# Patient Record
Sex: Female | Born: 1945
Health system: Southern US, Community
[De-identification: ages and names within clinical notes are randomized; demographics above are authoritative.]

## PROBLEM LIST (undated history)

## (undated) DIAGNOSIS — K219 Gastro-esophageal reflux disease without esophagitis: Secondary | ICD-10-CM

## (undated) DIAGNOSIS — F5104 Psychophysiologic insomnia: Secondary | ICD-10-CM

## (undated) DIAGNOSIS — K7460 Unspecified cirrhosis of liver: Secondary | ICD-10-CM

## (undated) DIAGNOSIS — K760 Fatty (change of) liver, not elsewhere classified: Secondary | ICD-10-CM

## (undated) DIAGNOSIS — IMO0002 Reserved for concepts with insufficient information to code with codable children: Secondary | ICD-10-CM

## (undated) DIAGNOSIS — H269 Unspecified cataract: Secondary | ICD-10-CM

## (undated) DIAGNOSIS — N951 Menopausal and female climacteric states: Secondary | ICD-10-CM

## (undated) DIAGNOSIS — I4891 Unspecified atrial fibrillation: Secondary | ICD-10-CM

## (undated) DIAGNOSIS — E876 Hypokalemia: Secondary | ICD-10-CM

## (undated) DIAGNOSIS — E785 Hyperlipidemia, unspecified: Secondary | ICD-10-CM

## (undated) DIAGNOSIS — E559 Vitamin D deficiency, unspecified: Secondary | ICD-10-CM

## (undated) DIAGNOSIS — I491 Atrial premature depolarization: Secondary | ICD-10-CM

## (undated) DIAGNOSIS — E11319 Type 2 diabetes mellitus with unspecified diabetic retinopathy without macular edema: Secondary | ICD-10-CM

## (undated) DIAGNOSIS — M47816 Spondylosis without myelopathy or radiculopathy, lumbar region: Secondary | ICD-10-CM

## (undated) DIAGNOSIS — M51369 Other intervertebral disc degeneration, lumbar region without mention of lumbar back pain or lower extremity pain: Secondary | ICD-10-CM

## (undated) DIAGNOSIS — Z87442 Personal history of urinary calculi: Secondary | ICD-10-CM

## (undated) DIAGNOSIS — M8589 Other specified disorders of bone density and structure, multiple sites: Secondary | ICD-10-CM

## (undated) DIAGNOSIS — E1165 Type 2 diabetes mellitus with hyperglycemia: Secondary | ICD-10-CM

## (undated) DIAGNOSIS — I10 Essential (primary) hypertension: Secondary | ICD-10-CM

## (undated) DIAGNOSIS — E1142 Type 2 diabetes mellitus with diabetic polyneuropathy: Secondary | ICD-10-CM

## (undated) DIAGNOSIS — M5136 Other intervertebral disc degeneration, lumbar region: Secondary | ICD-10-CM

## (undated) DIAGNOSIS — R252 Cramp and spasm: Secondary | ICD-10-CM

## (undated) HISTORY — PX: TOTAL KNEE ARTHROPLASTY: SHX125

## (undated) HISTORY — DX: Menopausal and female climacteric states: N95.1

## (undated) HISTORY — DX: Reserved for concepts with insufficient information to code with codable children: IMO0002

## (undated) HISTORY — DX: Hypokalemia: E87.6

## (undated) HISTORY — DX: Type 2 diabetes mellitus with hyperglycemia: E11.65

## (undated) HISTORY — DX: Type 2 diabetes mellitus with unspecified diabetic retinopathy without macular edema: E11.319

## (undated) HISTORY — DX: Vitamin D deficiency, unspecified: E55.9

## (undated) HISTORY — DX: Unspecified cirrhosis of liver: K74.60

## (undated) HISTORY — DX: Fatty (change of) liver, not elsewhere classified: K76.0

## (undated) HISTORY — DX: Hyperlipidemia, unspecified: E78.5

## (undated) HISTORY — DX: Spondylosis without myelopathy or radiculopathy, lumbar region: M47.816

## (undated) HISTORY — DX: Cramp and spasm: R25.2

## (undated) HISTORY — DX: Other intervertebral disc degeneration, lumbar region: M51.36

## (undated) HISTORY — DX: Other specified disorders of bone density and structure, multiple sites: M85.89

## (undated) HISTORY — PX: HEMORRHOID SURGERY: SHX153

## (undated) HISTORY — DX: Atrial premature depolarization: I49.1

## (undated) HISTORY — DX: Unspecified atrial fibrillation: I48.91

## (undated) HISTORY — DX: Other intervertebral disc degeneration, lumbar region without mention of lumbar back pain or lower extremity pain: M51.369

## (undated) HISTORY — DX: Unspecified cataract: H26.9

## (undated) HISTORY — DX: Type 2 diabetes mellitus with diabetic polyneuropathy: E11.42

## (undated) HISTORY — DX: Essential (primary) hypertension: I10

## (undated) HISTORY — DX: Psychophysiologic insomnia: F51.04

## (undated) HISTORY — DX: Gastro-esophageal reflux disease without esophagitis: K21.9

---

## 1975-07-30 HISTORY — PX: TUBAL LIGATION: SHX77

## 1975-07-30 HISTORY — PX: APPENDECTOMY: SHX54

## 1998-07-29 HISTORY — PX: MEDIAL PARTIAL KNEE REPLACEMENT: SHX5965

## 2003-07-30 HISTORY — PX: CARPAL TUNNEL RELEASE: SHX101

## 2004-07-29 HISTORY — PX: MEDIAL PARTIAL KNEE REPLACEMENT: SHX5965

## 2006-07-29 HISTORY — PX: TOTAL KNEE ARTHROPLASTY: SHX125

## 2010-07-29 HISTORY — PX: REPLACEMENT TOTAL KNEE: SUR1224

## 2015-11-01 ENCOUNTER — Ambulatory Visit: Payer: PRIVATE HEALTH INSURANCE | Admitting: Sports Medicine

## 2016-07-31 DIAGNOSIS — M25562 Pain in left knee: Secondary | ICD-10-CM | POA: Diagnosis not present

## 2016-07-31 DIAGNOSIS — Z96652 Presence of left artificial knee joint: Secondary | ICD-10-CM | POA: Diagnosis not present

## 2016-07-31 DIAGNOSIS — Z471 Aftercare following joint replacement surgery: Secondary | ICD-10-CM | POA: Diagnosis not present

## 2016-08-19 DIAGNOSIS — E785 Hyperlipidemia, unspecified: Secondary | ICD-10-CM | POA: Diagnosis not present

## 2016-08-19 DIAGNOSIS — E559 Vitamin D deficiency, unspecified: Secondary | ICD-10-CM | POA: Diagnosis not present

## 2016-08-19 DIAGNOSIS — E1140 Type 2 diabetes mellitus with diabetic neuropathy, unspecified: Secondary | ICD-10-CM | POA: Diagnosis not present

## 2016-08-21 DIAGNOSIS — K760 Fatty (change of) liver, not elsewhere classified: Secondary | ICD-10-CM | POA: Diagnosis not present

## 2016-08-21 DIAGNOSIS — E559 Vitamin D deficiency, unspecified: Secondary | ICD-10-CM | POA: Diagnosis not present

## 2016-08-21 DIAGNOSIS — E1140 Type 2 diabetes mellitus with diabetic neuropathy, unspecified: Secondary | ICD-10-CM | POA: Diagnosis not present

## 2016-08-21 DIAGNOSIS — E785 Hyperlipidemia, unspecified: Secondary | ICD-10-CM | POA: Diagnosis not present

## 2016-08-21 DIAGNOSIS — I10 Essential (primary) hypertension: Secondary | ICD-10-CM | POA: Diagnosis not present

## 2016-10-28 DIAGNOSIS — Z1231 Encounter for screening mammogram for malignant neoplasm of breast: Secondary | ICD-10-CM | POA: Diagnosis not present

## 2016-11-13 DIAGNOSIS — H00012 Hordeolum externum right lower eyelid: Secondary | ICD-10-CM | POA: Diagnosis not present

## 2016-11-20 DIAGNOSIS — E559 Vitamin D deficiency, unspecified: Secondary | ICD-10-CM | POA: Diagnosis not present

## 2016-11-20 DIAGNOSIS — E1140 Type 2 diabetes mellitus with diabetic neuropathy, unspecified: Secondary | ICD-10-CM | POA: Diagnosis not present

## 2016-11-20 DIAGNOSIS — E785 Hyperlipidemia, unspecified: Secondary | ICD-10-CM | POA: Diagnosis not present

## 2016-11-22 DIAGNOSIS — Z139 Encounter for screening, unspecified: Secondary | ICD-10-CM | POA: Diagnosis not present

## 2016-11-22 DIAGNOSIS — I10 Essential (primary) hypertension: Secondary | ICD-10-CM | POA: Diagnosis not present

## 2016-11-22 DIAGNOSIS — E1140 Type 2 diabetes mellitus with diabetic neuropathy, unspecified: Secondary | ICD-10-CM | POA: Diagnosis not present

## 2016-11-22 DIAGNOSIS — E785 Hyperlipidemia, unspecified: Secondary | ICD-10-CM | POA: Diagnosis not present

## 2016-11-22 DIAGNOSIS — E559 Vitamin D deficiency, unspecified: Secondary | ICD-10-CM | POA: Diagnosis not present

## 2017-02-24 DIAGNOSIS — E785 Hyperlipidemia, unspecified: Secondary | ICD-10-CM | POA: Diagnosis not present

## 2017-02-24 DIAGNOSIS — E559 Vitamin D deficiency, unspecified: Secondary | ICD-10-CM | POA: Diagnosis not present

## 2017-02-24 DIAGNOSIS — E1140 Type 2 diabetes mellitus with diabetic neuropathy, unspecified: Secondary | ICD-10-CM | POA: Diagnosis not present

## 2017-02-24 DIAGNOSIS — E1165 Type 2 diabetes mellitus with hyperglycemia: Secondary | ICD-10-CM | POA: Diagnosis not present

## 2017-02-26 DIAGNOSIS — I10 Essential (primary) hypertension: Secondary | ICD-10-CM | POA: Diagnosis not present

## 2017-02-26 DIAGNOSIS — E1140 Type 2 diabetes mellitus with diabetic neuropathy, unspecified: Secondary | ICD-10-CM | POA: Diagnosis not present

## 2017-02-26 DIAGNOSIS — Z139 Encounter for screening, unspecified: Secondary | ICD-10-CM | POA: Diagnosis not present

## 2017-02-26 DIAGNOSIS — Z9181 History of falling: Secondary | ICD-10-CM | POA: Diagnosis not present

## 2017-02-26 DIAGNOSIS — E785 Hyperlipidemia, unspecified: Secondary | ICD-10-CM | POA: Diagnosis not present

## 2017-02-26 DIAGNOSIS — E559 Vitamin D deficiency, unspecified: Secondary | ICD-10-CM | POA: Diagnosis not present

## 2017-03-11 DIAGNOSIS — L570 Actinic keratosis: Secondary | ICD-10-CM | POA: Diagnosis not present

## 2017-04-23 DIAGNOSIS — R39198 Other difficulties with micturition: Secondary | ICD-10-CM | POA: Diagnosis not present

## 2017-04-23 DIAGNOSIS — N8110 Cystocele, unspecified: Secondary | ICD-10-CM | POA: Diagnosis not present

## 2017-05-12 DIAGNOSIS — H2513 Age-related nuclear cataract, bilateral: Secondary | ICD-10-CM | POA: Diagnosis not present

## 2017-05-12 DIAGNOSIS — H524 Presbyopia: Secondary | ICD-10-CM | POA: Diagnosis not present

## 2017-05-12 DIAGNOSIS — E113293 Type 2 diabetes mellitus with mild nonproliferative diabetic retinopathy without macular edema, bilateral: Secondary | ICD-10-CM | POA: Diagnosis not present

## 2017-05-13 DIAGNOSIS — L570 Actinic keratosis: Secondary | ICD-10-CM | POA: Diagnosis not present

## 2017-05-19 DIAGNOSIS — N8110 Cystocele, unspecified: Secondary | ICD-10-CM | POA: Diagnosis not present

## 2017-05-19 DIAGNOSIS — R39198 Other difficulties with micturition: Secondary | ICD-10-CM | POA: Diagnosis not present

## 2017-05-23 DIAGNOSIS — R109 Unspecified abdominal pain: Secondary | ICD-10-CM | POA: Diagnosis not present

## 2017-05-24 DIAGNOSIS — B029 Zoster without complications: Secondary | ICD-10-CM | POA: Diagnosis not present

## 2017-06-02 DIAGNOSIS — E785 Hyperlipidemia, unspecified: Secondary | ICD-10-CM | POA: Diagnosis not present

## 2017-06-02 DIAGNOSIS — B0229 Other postherpetic nervous system involvement: Secondary | ICD-10-CM | POA: Diagnosis not present

## 2017-06-02 DIAGNOSIS — E559 Vitamin D deficiency, unspecified: Secondary | ICD-10-CM | POA: Diagnosis not present

## 2017-06-02 DIAGNOSIS — I10 Essential (primary) hypertension: Secondary | ICD-10-CM | POA: Diagnosis not present

## 2017-06-02 DIAGNOSIS — E1140 Type 2 diabetes mellitus with diabetic neuropathy, unspecified: Secondary | ICD-10-CM | POA: Diagnosis not present

## 2017-06-17 DIAGNOSIS — Z23 Encounter for immunization: Secondary | ICD-10-CM | POA: Diagnosis not present

## 2017-06-17 DIAGNOSIS — B0229 Other postherpetic nervous system involvement: Secondary | ICD-10-CM | POA: Diagnosis not present

## 2017-06-23 DIAGNOSIS — R39198 Other difficulties with micturition: Secondary | ICD-10-CM | POA: Diagnosis not present

## 2017-06-23 DIAGNOSIS — N8110 Cystocele, unspecified: Secondary | ICD-10-CM | POA: Diagnosis not present

## 2017-06-23 DIAGNOSIS — Z01810 Encounter for preprocedural cardiovascular examination: Secondary | ICD-10-CM | POA: Diagnosis not present

## 2017-06-24 DIAGNOSIS — N8110 Cystocele, unspecified: Secondary | ICD-10-CM | POA: Diagnosis not present

## 2017-06-26 DIAGNOSIS — N8110 Cystocele, unspecified: Secondary | ICD-10-CM | POA: Diagnosis not present

## 2017-06-26 HISTORY — PX: BLADDER SURGERY: SHX569

## 2017-06-27 DIAGNOSIS — N8110 Cystocele, unspecified: Secondary | ICD-10-CM | POA: Diagnosis not present

## 2017-06-30 DIAGNOSIS — N390 Urinary tract infection, site not specified: Secondary | ICD-10-CM | POA: Diagnosis not present

## 2017-07-03 DIAGNOSIS — M5137 Other intervertebral disc degeneration, lumbosacral region: Secondary | ICD-10-CM | POA: Diagnosis not present

## 2017-07-03 DIAGNOSIS — M9904 Segmental and somatic dysfunction of sacral region: Secondary | ICD-10-CM | POA: Diagnosis not present

## 2017-07-03 DIAGNOSIS — M5136 Other intervertebral disc degeneration, lumbar region: Secondary | ICD-10-CM | POA: Diagnosis not present

## 2017-07-03 DIAGNOSIS — M9903 Segmental and somatic dysfunction of lumbar region: Secondary | ICD-10-CM | POA: Diagnosis not present

## 2017-07-03 DIAGNOSIS — M5442 Lumbago with sciatica, left side: Secondary | ICD-10-CM | POA: Diagnosis not present

## 2017-07-04 DIAGNOSIS — M5137 Other intervertebral disc degeneration, lumbosacral region: Secondary | ICD-10-CM | POA: Diagnosis not present

## 2017-07-04 DIAGNOSIS — M5136 Other intervertebral disc degeneration, lumbar region: Secondary | ICD-10-CM | POA: Diagnosis not present

## 2017-07-04 DIAGNOSIS — M5442 Lumbago with sciatica, left side: Secondary | ICD-10-CM | POA: Diagnosis not present

## 2017-07-04 DIAGNOSIS — M9903 Segmental and somatic dysfunction of lumbar region: Secondary | ICD-10-CM | POA: Diagnosis not present

## 2017-07-04 DIAGNOSIS — M9904 Segmental and somatic dysfunction of sacral region: Secondary | ICD-10-CM | POA: Diagnosis not present

## 2017-07-08 DIAGNOSIS — M5137 Other intervertebral disc degeneration, lumbosacral region: Secondary | ICD-10-CM | POA: Diagnosis not present

## 2017-07-08 DIAGNOSIS — M5442 Lumbago with sciatica, left side: Secondary | ICD-10-CM | POA: Diagnosis not present

## 2017-07-08 DIAGNOSIS — M9903 Segmental and somatic dysfunction of lumbar region: Secondary | ICD-10-CM | POA: Diagnosis not present

## 2017-07-08 DIAGNOSIS — M5136 Other intervertebral disc degeneration, lumbar region: Secondary | ICD-10-CM | POA: Diagnosis not present

## 2017-07-08 DIAGNOSIS — M9904 Segmental and somatic dysfunction of sacral region: Secondary | ICD-10-CM | POA: Diagnosis not present

## 2017-07-09 DIAGNOSIS — M5137 Other intervertebral disc degeneration, lumbosacral region: Secondary | ICD-10-CM | POA: Diagnosis not present

## 2017-07-09 DIAGNOSIS — M5442 Lumbago with sciatica, left side: Secondary | ICD-10-CM | POA: Diagnosis not present

## 2017-07-09 DIAGNOSIS — M5136 Other intervertebral disc degeneration, lumbar region: Secondary | ICD-10-CM | POA: Diagnosis not present

## 2017-07-09 DIAGNOSIS — M9904 Segmental and somatic dysfunction of sacral region: Secondary | ICD-10-CM | POA: Diagnosis not present

## 2017-07-09 DIAGNOSIS — M9903 Segmental and somatic dysfunction of lumbar region: Secondary | ICD-10-CM | POA: Diagnosis not present

## 2017-07-10 DIAGNOSIS — M9904 Segmental and somatic dysfunction of sacral region: Secondary | ICD-10-CM | POA: Diagnosis not present

## 2017-07-10 DIAGNOSIS — M5442 Lumbago with sciatica, left side: Secondary | ICD-10-CM | POA: Diagnosis not present

## 2017-07-10 DIAGNOSIS — M5136 Other intervertebral disc degeneration, lumbar region: Secondary | ICD-10-CM | POA: Diagnosis not present

## 2017-07-10 DIAGNOSIS — M5137 Other intervertebral disc degeneration, lumbosacral region: Secondary | ICD-10-CM | POA: Diagnosis not present

## 2017-07-10 DIAGNOSIS — M9903 Segmental and somatic dysfunction of lumbar region: Secondary | ICD-10-CM | POA: Diagnosis not present

## 2017-07-11 DIAGNOSIS — N8110 Cystocele, unspecified: Secondary | ICD-10-CM | POA: Diagnosis not present

## 2017-07-14 DIAGNOSIS — M5442 Lumbago with sciatica, left side: Secondary | ICD-10-CM | POA: Diagnosis not present

## 2017-07-14 DIAGNOSIS — M9903 Segmental and somatic dysfunction of lumbar region: Secondary | ICD-10-CM | POA: Diagnosis not present

## 2017-07-14 DIAGNOSIS — M5137 Other intervertebral disc degeneration, lumbosacral region: Secondary | ICD-10-CM | POA: Diagnosis not present

## 2017-07-14 DIAGNOSIS — M9904 Segmental and somatic dysfunction of sacral region: Secondary | ICD-10-CM | POA: Diagnosis not present

## 2017-07-14 DIAGNOSIS — M5136 Other intervertebral disc degeneration, lumbar region: Secondary | ICD-10-CM | POA: Diagnosis not present

## 2017-07-15 DIAGNOSIS — M9904 Segmental and somatic dysfunction of sacral region: Secondary | ICD-10-CM | POA: Diagnosis not present

## 2017-07-15 DIAGNOSIS — M5136 Other intervertebral disc degeneration, lumbar region: Secondary | ICD-10-CM | POA: Diagnosis not present

## 2017-07-15 DIAGNOSIS — M5137 Other intervertebral disc degeneration, lumbosacral region: Secondary | ICD-10-CM | POA: Diagnosis not present

## 2017-07-15 DIAGNOSIS — M5442 Lumbago with sciatica, left side: Secondary | ICD-10-CM | POA: Diagnosis not present

## 2017-07-15 DIAGNOSIS — M9903 Segmental and somatic dysfunction of lumbar region: Secondary | ICD-10-CM | POA: Diagnosis not present

## 2017-07-16 DIAGNOSIS — M9903 Segmental and somatic dysfunction of lumbar region: Secondary | ICD-10-CM | POA: Diagnosis not present

## 2017-07-16 DIAGNOSIS — M9904 Segmental and somatic dysfunction of sacral region: Secondary | ICD-10-CM | POA: Diagnosis not present

## 2017-07-16 DIAGNOSIS — M5136 Other intervertebral disc degeneration, lumbar region: Secondary | ICD-10-CM | POA: Diagnosis not present

## 2017-07-16 DIAGNOSIS — M5442 Lumbago with sciatica, left side: Secondary | ICD-10-CM | POA: Diagnosis not present

## 2017-07-16 DIAGNOSIS — M5137 Other intervertebral disc degeneration, lumbosacral region: Secondary | ICD-10-CM | POA: Diagnosis not present

## 2017-07-17 DIAGNOSIS — M9903 Segmental and somatic dysfunction of lumbar region: Secondary | ICD-10-CM | POA: Diagnosis not present

## 2017-07-17 DIAGNOSIS — M5136 Other intervertebral disc degeneration, lumbar region: Secondary | ICD-10-CM | POA: Diagnosis not present

## 2017-07-17 DIAGNOSIS — M5442 Lumbago with sciatica, left side: Secondary | ICD-10-CM | POA: Diagnosis not present

## 2017-07-17 DIAGNOSIS — M9904 Segmental and somatic dysfunction of sacral region: Secondary | ICD-10-CM | POA: Diagnosis not present

## 2017-07-17 DIAGNOSIS — M5137 Other intervertebral disc degeneration, lumbosacral region: Secondary | ICD-10-CM | POA: Diagnosis not present

## 2017-07-18 DIAGNOSIS — M5137 Other intervertebral disc degeneration, lumbosacral region: Secondary | ICD-10-CM | POA: Diagnosis not present

## 2017-07-18 DIAGNOSIS — M5442 Lumbago with sciatica, left side: Secondary | ICD-10-CM | POA: Diagnosis not present

## 2017-07-18 DIAGNOSIS — M9903 Segmental and somatic dysfunction of lumbar region: Secondary | ICD-10-CM | POA: Diagnosis not present

## 2017-07-18 DIAGNOSIS — M5136 Other intervertebral disc degeneration, lumbar region: Secondary | ICD-10-CM | POA: Diagnosis not present

## 2017-07-18 DIAGNOSIS — M9904 Segmental and somatic dysfunction of sacral region: Secondary | ICD-10-CM | POA: Diagnosis not present

## 2017-07-23 DIAGNOSIS — M9904 Segmental and somatic dysfunction of sacral region: Secondary | ICD-10-CM | POA: Diagnosis not present

## 2017-07-23 DIAGNOSIS — M5137 Other intervertebral disc degeneration, lumbosacral region: Secondary | ICD-10-CM | POA: Diagnosis not present

## 2017-07-23 DIAGNOSIS — M9903 Segmental and somatic dysfunction of lumbar region: Secondary | ICD-10-CM | POA: Diagnosis not present

## 2017-07-23 DIAGNOSIS — M5136 Other intervertebral disc degeneration, lumbar region: Secondary | ICD-10-CM | POA: Diagnosis not present

## 2017-07-23 DIAGNOSIS — M5442 Lumbago with sciatica, left side: Secondary | ICD-10-CM | POA: Diagnosis not present

## 2017-07-24 DIAGNOSIS — M5137 Other intervertebral disc degeneration, lumbosacral region: Secondary | ICD-10-CM | POA: Diagnosis not present

## 2017-07-24 DIAGNOSIS — M9903 Segmental and somatic dysfunction of lumbar region: Secondary | ICD-10-CM | POA: Diagnosis not present

## 2017-07-24 DIAGNOSIS — M5442 Lumbago with sciatica, left side: Secondary | ICD-10-CM | POA: Diagnosis not present

## 2017-07-24 DIAGNOSIS — M5136 Other intervertebral disc degeneration, lumbar region: Secondary | ICD-10-CM | POA: Diagnosis not present

## 2017-07-24 DIAGNOSIS — M9904 Segmental and somatic dysfunction of sacral region: Secondary | ICD-10-CM | POA: Diagnosis not present

## 2017-07-25 DIAGNOSIS — M5136 Other intervertebral disc degeneration, lumbar region: Secondary | ICD-10-CM | POA: Diagnosis not present

## 2017-07-25 DIAGNOSIS — M5442 Lumbago with sciatica, left side: Secondary | ICD-10-CM | POA: Diagnosis not present

## 2017-07-25 DIAGNOSIS — M9903 Segmental and somatic dysfunction of lumbar region: Secondary | ICD-10-CM | POA: Diagnosis not present

## 2017-07-25 DIAGNOSIS — M9904 Segmental and somatic dysfunction of sacral region: Secondary | ICD-10-CM | POA: Diagnosis not present

## 2017-07-25 DIAGNOSIS — M5137 Other intervertebral disc degeneration, lumbosacral region: Secondary | ICD-10-CM | POA: Diagnosis not present

## 2017-07-28 DIAGNOSIS — Z1331 Encounter for screening for depression: Secondary | ICD-10-CM | POA: Diagnosis not present

## 2017-07-28 DIAGNOSIS — Z136 Encounter for screening for cardiovascular disorders: Secondary | ICD-10-CM | POA: Diagnosis not present

## 2017-07-28 DIAGNOSIS — E785 Hyperlipidemia, unspecified: Secondary | ICD-10-CM | POA: Diagnosis not present

## 2017-07-28 DIAGNOSIS — Z1231 Encounter for screening mammogram for malignant neoplasm of breast: Secondary | ICD-10-CM | POA: Diagnosis not present

## 2017-07-28 DIAGNOSIS — Z0000 Encounter for general adult medical examination without abnormal findings: Secondary | ICD-10-CM | POA: Diagnosis not present

## 2017-07-28 DIAGNOSIS — Z9181 History of falling: Secondary | ICD-10-CM | POA: Diagnosis not present

## 2017-07-30 DIAGNOSIS — M5136 Other intervertebral disc degeneration, lumbar region: Secondary | ICD-10-CM | POA: Diagnosis not present

## 2017-07-30 DIAGNOSIS — M9903 Segmental and somatic dysfunction of lumbar region: Secondary | ICD-10-CM | POA: Diagnosis not present

## 2017-07-30 DIAGNOSIS — M5442 Lumbago with sciatica, left side: Secondary | ICD-10-CM | POA: Diagnosis not present

## 2017-07-30 DIAGNOSIS — M5137 Other intervertebral disc degeneration, lumbosacral region: Secondary | ICD-10-CM | POA: Diagnosis not present

## 2017-07-30 DIAGNOSIS — M9904 Segmental and somatic dysfunction of sacral region: Secondary | ICD-10-CM | POA: Diagnosis not present

## 2017-08-01 DIAGNOSIS — M5137 Other intervertebral disc degeneration, lumbosacral region: Secondary | ICD-10-CM | POA: Diagnosis not present

## 2017-08-01 DIAGNOSIS — M5136 Other intervertebral disc degeneration, lumbar region: Secondary | ICD-10-CM | POA: Diagnosis not present

## 2017-08-01 DIAGNOSIS — M5442 Lumbago with sciatica, left side: Secondary | ICD-10-CM | POA: Diagnosis not present

## 2017-08-01 DIAGNOSIS — M9903 Segmental and somatic dysfunction of lumbar region: Secondary | ICD-10-CM | POA: Diagnosis not present

## 2017-08-01 DIAGNOSIS — M9904 Segmental and somatic dysfunction of sacral region: Secondary | ICD-10-CM | POA: Diagnosis not present

## 2017-08-04 DIAGNOSIS — M5136 Other intervertebral disc degeneration, lumbar region: Secondary | ICD-10-CM | POA: Diagnosis not present

## 2017-08-04 DIAGNOSIS — M5442 Lumbago with sciatica, left side: Secondary | ICD-10-CM | POA: Diagnosis not present

## 2017-08-04 DIAGNOSIS — M9903 Segmental and somatic dysfunction of lumbar region: Secondary | ICD-10-CM | POA: Diagnosis not present

## 2017-08-04 DIAGNOSIS — M9904 Segmental and somatic dysfunction of sacral region: Secondary | ICD-10-CM | POA: Diagnosis not present

## 2017-08-04 DIAGNOSIS — M5137 Other intervertebral disc degeneration, lumbosacral region: Secondary | ICD-10-CM | POA: Diagnosis not present

## 2017-08-06 DIAGNOSIS — M5137 Other intervertebral disc degeneration, lumbosacral region: Secondary | ICD-10-CM | POA: Diagnosis not present

## 2017-08-06 DIAGNOSIS — M5136 Other intervertebral disc degeneration, lumbar region: Secondary | ICD-10-CM | POA: Diagnosis not present

## 2017-08-06 DIAGNOSIS — M9903 Segmental and somatic dysfunction of lumbar region: Secondary | ICD-10-CM | POA: Diagnosis not present

## 2017-08-06 DIAGNOSIS — M9904 Segmental and somatic dysfunction of sacral region: Secondary | ICD-10-CM | POA: Diagnosis not present

## 2017-08-06 DIAGNOSIS — M5442 Lumbago with sciatica, left side: Secondary | ICD-10-CM | POA: Diagnosis not present

## 2017-08-08 DIAGNOSIS — M5137 Other intervertebral disc degeneration, lumbosacral region: Secondary | ICD-10-CM | POA: Diagnosis not present

## 2017-08-08 DIAGNOSIS — M9903 Segmental and somatic dysfunction of lumbar region: Secondary | ICD-10-CM | POA: Diagnosis not present

## 2017-08-08 DIAGNOSIS — M5442 Lumbago with sciatica, left side: Secondary | ICD-10-CM | POA: Diagnosis not present

## 2017-08-08 DIAGNOSIS — M9904 Segmental and somatic dysfunction of sacral region: Secondary | ICD-10-CM | POA: Diagnosis not present

## 2017-08-08 DIAGNOSIS — M5136 Other intervertebral disc degeneration, lumbar region: Secondary | ICD-10-CM | POA: Diagnosis not present

## 2017-08-11 DIAGNOSIS — M5137 Other intervertebral disc degeneration, lumbosacral region: Secondary | ICD-10-CM | POA: Diagnosis not present

## 2017-08-11 DIAGNOSIS — M5442 Lumbago with sciatica, left side: Secondary | ICD-10-CM | POA: Diagnosis not present

## 2017-08-11 DIAGNOSIS — M9903 Segmental and somatic dysfunction of lumbar region: Secondary | ICD-10-CM | POA: Diagnosis not present

## 2017-08-11 DIAGNOSIS — M5136 Other intervertebral disc degeneration, lumbar region: Secondary | ICD-10-CM | POA: Diagnosis not present

## 2017-08-11 DIAGNOSIS — M9904 Segmental and somatic dysfunction of sacral region: Secondary | ICD-10-CM | POA: Diagnosis not present

## 2017-08-20 DIAGNOSIS — M5442 Lumbago with sciatica, left side: Secondary | ICD-10-CM | POA: Diagnosis not present

## 2017-08-20 DIAGNOSIS — M5136 Other intervertebral disc degeneration, lumbar region: Secondary | ICD-10-CM | POA: Diagnosis not present

## 2017-08-20 DIAGNOSIS — M5137 Other intervertebral disc degeneration, lumbosacral region: Secondary | ICD-10-CM | POA: Diagnosis not present

## 2017-08-20 DIAGNOSIS — M9904 Segmental and somatic dysfunction of sacral region: Secondary | ICD-10-CM | POA: Diagnosis not present

## 2017-08-20 DIAGNOSIS — M9903 Segmental and somatic dysfunction of lumbar region: Secondary | ICD-10-CM | POA: Diagnosis not present

## 2017-08-25 DIAGNOSIS — M5136 Other intervertebral disc degeneration, lumbar region: Secondary | ICD-10-CM | POA: Diagnosis not present

## 2017-08-25 DIAGNOSIS — M5442 Lumbago with sciatica, left side: Secondary | ICD-10-CM | POA: Diagnosis not present

## 2017-08-25 DIAGNOSIS — M5137 Other intervertebral disc degeneration, lumbosacral region: Secondary | ICD-10-CM | POA: Diagnosis not present

## 2017-08-25 DIAGNOSIS — M9904 Segmental and somatic dysfunction of sacral region: Secondary | ICD-10-CM | POA: Diagnosis not present

## 2017-08-25 DIAGNOSIS — M9903 Segmental and somatic dysfunction of lumbar region: Secondary | ICD-10-CM | POA: Diagnosis not present

## 2017-09-02 DIAGNOSIS — E1140 Type 2 diabetes mellitus with diabetic neuropathy, unspecified: Secondary | ICD-10-CM | POA: Diagnosis not present

## 2017-09-02 DIAGNOSIS — E785 Hyperlipidemia, unspecified: Secondary | ICD-10-CM | POA: Diagnosis not present

## 2017-09-02 DIAGNOSIS — E559 Vitamin D deficiency, unspecified: Secondary | ICD-10-CM | POA: Diagnosis not present

## 2017-09-03 DIAGNOSIS — E1140 Type 2 diabetes mellitus with diabetic neuropathy, unspecified: Secondary | ICD-10-CM | POA: Diagnosis not present

## 2017-09-03 DIAGNOSIS — E785 Hyperlipidemia, unspecified: Secondary | ICD-10-CM | POA: Diagnosis not present

## 2017-09-03 DIAGNOSIS — E559 Vitamin D deficiency, unspecified: Secondary | ICD-10-CM | POA: Diagnosis not present

## 2017-09-03 DIAGNOSIS — I10 Essential (primary) hypertension: Secondary | ICD-10-CM | POA: Diagnosis not present

## 2017-09-05 DIAGNOSIS — M9904 Segmental and somatic dysfunction of sacral region: Secondary | ICD-10-CM | POA: Diagnosis not present

## 2017-09-05 DIAGNOSIS — M9903 Segmental and somatic dysfunction of lumbar region: Secondary | ICD-10-CM | POA: Diagnosis not present

## 2017-09-05 DIAGNOSIS — M5136 Other intervertebral disc degeneration, lumbar region: Secondary | ICD-10-CM | POA: Diagnosis not present

## 2017-09-05 DIAGNOSIS — M5442 Lumbago with sciatica, left side: Secondary | ICD-10-CM | POA: Diagnosis not present

## 2017-09-05 DIAGNOSIS — M5137 Other intervertebral disc degeneration, lumbosacral region: Secondary | ICD-10-CM | POA: Diagnosis not present

## 2017-10-01 DIAGNOSIS — M5442 Lumbago with sciatica, left side: Secondary | ICD-10-CM | POA: Diagnosis not present

## 2017-10-01 DIAGNOSIS — M5137 Other intervertebral disc degeneration, lumbosacral region: Secondary | ICD-10-CM | POA: Diagnosis not present

## 2017-10-01 DIAGNOSIS — M9904 Segmental and somatic dysfunction of sacral region: Secondary | ICD-10-CM | POA: Diagnosis not present

## 2017-10-01 DIAGNOSIS — M9903 Segmental and somatic dysfunction of lumbar region: Secondary | ICD-10-CM | POA: Diagnosis not present

## 2017-10-01 DIAGNOSIS — M5136 Other intervertebral disc degeneration, lumbar region: Secondary | ICD-10-CM | POA: Diagnosis not present

## 2017-10-30 DIAGNOSIS — Z1231 Encounter for screening mammogram for malignant neoplasm of breast: Secondary | ICD-10-CM | POA: Diagnosis not present

## 2017-11-05 DIAGNOSIS — M9903 Segmental and somatic dysfunction of lumbar region: Secondary | ICD-10-CM | POA: Diagnosis not present

## 2017-11-05 DIAGNOSIS — M9904 Segmental and somatic dysfunction of sacral region: Secondary | ICD-10-CM | POA: Diagnosis not present

## 2017-11-05 DIAGNOSIS — M5442 Lumbago with sciatica, left side: Secondary | ICD-10-CM | POA: Diagnosis not present

## 2017-11-05 DIAGNOSIS — M5136 Other intervertebral disc degeneration, lumbar region: Secondary | ICD-10-CM | POA: Diagnosis not present

## 2017-11-05 DIAGNOSIS — M5137 Other intervertebral disc degeneration, lumbosacral region: Secondary | ICD-10-CM | POA: Diagnosis not present

## 2017-11-10 DIAGNOSIS — H2513 Age-related nuclear cataract, bilateral: Secondary | ICD-10-CM | POA: Diagnosis not present

## 2017-11-10 DIAGNOSIS — E113293 Type 2 diabetes mellitus with mild nonproliferative diabetic retinopathy without macular edema, bilateral: Secondary | ICD-10-CM | POA: Diagnosis not present

## 2017-12-01 DIAGNOSIS — E785 Hyperlipidemia, unspecified: Secondary | ICD-10-CM | POA: Diagnosis not present

## 2017-12-01 DIAGNOSIS — E559 Vitamin D deficiency, unspecified: Secondary | ICD-10-CM | POA: Diagnosis not present

## 2017-12-01 DIAGNOSIS — E1140 Type 2 diabetes mellitus with diabetic neuropathy, unspecified: Secondary | ICD-10-CM | POA: Diagnosis not present

## 2017-12-03 DIAGNOSIS — E559 Vitamin D deficiency, unspecified: Secondary | ICD-10-CM | POA: Diagnosis not present

## 2017-12-03 DIAGNOSIS — H6121 Impacted cerumen, right ear: Secondary | ICD-10-CM | POA: Diagnosis not present

## 2017-12-03 DIAGNOSIS — I10 Essential (primary) hypertension: Secondary | ICD-10-CM | POA: Diagnosis not present

## 2017-12-03 DIAGNOSIS — E1140 Type 2 diabetes mellitus with diabetic neuropathy, unspecified: Secondary | ICD-10-CM | POA: Diagnosis not present

## 2017-12-03 DIAGNOSIS — E785 Hyperlipidemia, unspecified: Secondary | ICD-10-CM | POA: Diagnosis not present

## 2017-12-04 DIAGNOSIS — M5442 Lumbago with sciatica, left side: Secondary | ICD-10-CM | POA: Diagnosis not present

## 2017-12-04 DIAGNOSIS — M5137 Other intervertebral disc degeneration, lumbosacral region: Secondary | ICD-10-CM | POA: Diagnosis not present

## 2017-12-04 DIAGNOSIS — M9903 Segmental and somatic dysfunction of lumbar region: Secondary | ICD-10-CM | POA: Diagnosis not present

## 2017-12-04 DIAGNOSIS — M5136 Other intervertebral disc degeneration, lumbar region: Secondary | ICD-10-CM | POA: Diagnosis not present

## 2017-12-04 DIAGNOSIS — M9904 Segmental and somatic dysfunction of sacral region: Secondary | ICD-10-CM | POA: Diagnosis not present

## 2017-12-16 DIAGNOSIS — L570 Actinic keratosis: Secondary | ICD-10-CM | POA: Diagnosis not present

## 2017-12-19 DIAGNOSIS — M25551 Pain in right hip: Secondary | ICD-10-CM | POA: Diagnosis not present

## 2017-12-19 DIAGNOSIS — R1031 Right lower quadrant pain: Secondary | ICD-10-CM | POA: Diagnosis not present

## 2017-12-19 DIAGNOSIS — M545 Low back pain: Secondary | ICD-10-CM | POA: Diagnosis not present

## 2017-12-19 DIAGNOSIS — M5136 Other intervertebral disc degeneration, lumbar region: Secondary | ICD-10-CM | POA: Diagnosis not present

## 2017-12-26 DIAGNOSIS — R9389 Abnormal findings on diagnostic imaging of other specified body structures: Secondary | ICD-10-CM | POA: Diagnosis not present

## 2017-12-26 DIAGNOSIS — R1031 Right lower quadrant pain: Secondary | ICD-10-CM | POA: Diagnosis not present

## 2017-12-31 DIAGNOSIS — M5137 Other intervertebral disc degeneration, lumbosacral region: Secondary | ICD-10-CM | POA: Diagnosis not present

## 2017-12-31 DIAGNOSIS — M9903 Segmental and somatic dysfunction of lumbar region: Secondary | ICD-10-CM | POA: Diagnosis not present

## 2017-12-31 DIAGNOSIS — M9904 Segmental and somatic dysfunction of sacral region: Secondary | ICD-10-CM | POA: Diagnosis not present

## 2017-12-31 DIAGNOSIS — M5442 Lumbago with sciatica, left side: Secondary | ICD-10-CM | POA: Diagnosis not present

## 2017-12-31 DIAGNOSIS — M5136 Other intervertebral disc degeneration, lumbar region: Secondary | ICD-10-CM | POA: Diagnosis not present

## 2018-01-02 DIAGNOSIS — M5137 Other intervertebral disc degeneration, lumbosacral region: Secondary | ICD-10-CM | POA: Diagnosis not present

## 2018-01-02 DIAGNOSIS — M5442 Lumbago with sciatica, left side: Secondary | ICD-10-CM | POA: Diagnosis not present

## 2018-01-02 DIAGNOSIS — M5136 Other intervertebral disc degeneration, lumbar region: Secondary | ICD-10-CM | POA: Diagnosis not present

## 2018-01-02 DIAGNOSIS — M9904 Segmental and somatic dysfunction of sacral region: Secondary | ICD-10-CM | POA: Diagnosis not present

## 2018-01-02 DIAGNOSIS — M9903 Segmental and somatic dysfunction of lumbar region: Secondary | ICD-10-CM | POA: Diagnosis not present

## 2018-01-05 DIAGNOSIS — M9903 Segmental and somatic dysfunction of lumbar region: Secondary | ICD-10-CM | POA: Diagnosis not present

## 2018-01-05 DIAGNOSIS — M5136 Other intervertebral disc degeneration, lumbar region: Secondary | ICD-10-CM | POA: Diagnosis not present

## 2018-01-05 DIAGNOSIS — M5442 Lumbago with sciatica, left side: Secondary | ICD-10-CM | POA: Diagnosis not present

## 2018-01-05 DIAGNOSIS — M9904 Segmental and somatic dysfunction of sacral region: Secondary | ICD-10-CM | POA: Diagnosis not present

## 2018-01-05 DIAGNOSIS — M5137 Other intervertebral disc degeneration, lumbosacral region: Secondary | ICD-10-CM | POA: Diagnosis not present

## 2018-01-08 DIAGNOSIS — M9903 Segmental and somatic dysfunction of lumbar region: Secondary | ICD-10-CM | POA: Diagnosis not present

## 2018-01-08 DIAGNOSIS — M5136 Other intervertebral disc degeneration, lumbar region: Secondary | ICD-10-CM | POA: Diagnosis not present

## 2018-01-08 DIAGNOSIS — M9904 Segmental and somatic dysfunction of sacral region: Secondary | ICD-10-CM | POA: Diagnosis not present

## 2018-01-08 DIAGNOSIS — M5137 Other intervertebral disc degeneration, lumbosacral region: Secondary | ICD-10-CM | POA: Diagnosis not present

## 2018-01-08 DIAGNOSIS — M5442 Lumbago with sciatica, left side: Secondary | ICD-10-CM | POA: Diagnosis not present

## 2018-01-12 DIAGNOSIS — M5136 Other intervertebral disc degeneration, lumbar region: Secondary | ICD-10-CM | POA: Diagnosis not present

## 2018-01-12 DIAGNOSIS — M5137 Other intervertebral disc degeneration, lumbosacral region: Secondary | ICD-10-CM | POA: Diagnosis not present

## 2018-01-12 DIAGNOSIS — M9904 Segmental and somatic dysfunction of sacral region: Secondary | ICD-10-CM | POA: Diagnosis not present

## 2018-01-12 DIAGNOSIS — M5442 Lumbago with sciatica, left side: Secondary | ICD-10-CM | POA: Diagnosis not present

## 2018-01-12 DIAGNOSIS — M9903 Segmental and somatic dysfunction of lumbar region: Secondary | ICD-10-CM | POA: Diagnosis not present

## 2018-01-14 DIAGNOSIS — M5442 Lumbago with sciatica, left side: Secondary | ICD-10-CM | POA: Diagnosis not present

## 2018-01-14 DIAGNOSIS — M9904 Segmental and somatic dysfunction of sacral region: Secondary | ICD-10-CM | POA: Diagnosis not present

## 2018-01-14 DIAGNOSIS — M5136 Other intervertebral disc degeneration, lumbar region: Secondary | ICD-10-CM | POA: Diagnosis not present

## 2018-01-14 DIAGNOSIS — M5137 Other intervertebral disc degeneration, lumbosacral region: Secondary | ICD-10-CM | POA: Diagnosis not present

## 2018-01-14 DIAGNOSIS — M9903 Segmental and somatic dysfunction of lumbar region: Secondary | ICD-10-CM | POA: Diagnosis not present

## 2018-01-16 DIAGNOSIS — M5136 Other intervertebral disc degeneration, lumbar region: Secondary | ICD-10-CM | POA: Diagnosis not present

## 2018-01-16 DIAGNOSIS — M545 Low back pain: Secondary | ICD-10-CM | POA: Diagnosis not present

## 2018-01-19 DIAGNOSIS — M5136 Other intervertebral disc degeneration, lumbar region: Secondary | ICD-10-CM | POA: Diagnosis not present

## 2018-01-19 DIAGNOSIS — M9903 Segmental and somatic dysfunction of lumbar region: Secondary | ICD-10-CM | POA: Diagnosis not present

## 2018-01-19 DIAGNOSIS — M5137 Other intervertebral disc degeneration, lumbosacral region: Secondary | ICD-10-CM | POA: Diagnosis not present

## 2018-01-19 DIAGNOSIS — M9904 Segmental and somatic dysfunction of sacral region: Secondary | ICD-10-CM | POA: Diagnosis not present

## 2018-01-19 DIAGNOSIS — M5442 Lumbago with sciatica, left side: Secondary | ICD-10-CM | POA: Diagnosis not present

## 2018-01-20 DIAGNOSIS — M545 Low back pain: Secondary | ICD-10-CM | POA: Diagnosis not present

## 2018-01-20 DIAGNOSIS — M5136 Other intervertebral disc degeneration, lumbar region: Secondary | ICD-10-CM | POA: Diagnosis not present

## 2018-01-23 DIAGNOSIS — M9904 Segmental and somatic dysfunction of sacral region: Secondary | ICD-10-CM | POA: Diagnosis not present

## 2018-01-23 DIAGNOSIS — M9903 Segmental and somatic dysfunction of lumbar region: Secondary | ICD-10-CM | POA: Diagnosis not present

## 2018-01-23 DIAGNOSIS — M5136 Other intervertebral disc degeneration, lumbar region: Secondary | ICD-10-CM | POA: Diagnosis not present

## 2018-01-23 DIAGNOSIS — M5137 Other intervertebral disc degeneration, lumbosacral region: Secondary | ICD-10-CM | POA: Diagnosis not present

## 2018-01-23 DIAGNOSIS — M5442 Lumbago with sciatica, left side: Secondary | ICD-10-CM | POA: Diagnosis not present

## 2018-01-26 DIAGNOSIS — M5137 Other intervertebral disc degeneration, lumbosacral region: Secondary | ICD-10-CM | POA: Diagnosis not present

## 2018-01-26 DIAGNOSIS — M5442 Lumbago with sciatica, left side: Secondary | ICD-10-CM | POA: Diagnosis not present

## 2018-01-26 DIAGNOSIS — M5136 Other intervertebral disc degeneration, lumbar region: Secondary | ICD-10-CM | POA: Diagnosis not present

## 2018-01-26 DIAGNOSIS — M9904 Segmental and somatic dysfunction of sacral region: Secondary | ICD-10-CM | POA: Diagnosis not present

## 2018-01-26 DIAGNOSIS — M9903 Segmental and somatic dysfunction of lumbar region: Secondary | ICD-10-CM | POA: Diagnosis not present

## 2018-01-30 DIAGNOSIS — J208 Acute bronchitis due to other specified organisms: Secondary | ICD-10-CM | POA: Diagnosis not present

## 2018-02-11 DIAGNOSIS — M9903 Segmental and somatic dysfunction of lumbar region: Secondary | ICD-10-CM | POA: Diagnosis not present

## 2018-02-11 DIAGNOSIS — M5442 Lumbago with sciatica, left side: Secondary | ICD-10-CM | POA: Diagnosis not present

## 2018-02-11 DIAGNOSIS — M5137 Other intervertebral disc degeneration, lumbosacral region: Secondary | ICD-10-CM | POA: Diagnosis not present

## 2018-02-11 DIAGNOSIS — M5136 Other intervertebral disc degeneration, lumbar region: Secondary | ICD-10-CM | POA: Diagnosis not present

## 2018-02-11 DIAGNOSIS — M9904 Segmental and somatic dysfunction of sacral region: Secondary | ICD-10-CM | POA: Diagnosis not present

## 2018-03-09 DIAGNOSIS — E1140 Type 2 diabetes mellitus with diabetic neuropathy, unspecified: Secondary | ICD-10-CM | POA: Diagnosis not present

## 2018-03-09 DIAGNOSIS — E785 Hyperlipidemia, unspecified: Secondary | ICD-10-CM | POA: Diagnosis not present

## 2018-03-09 DIAGNOSIS — E559 Vitamin D deficiency, unspecified: Secondary | ICD-10-CM | POA: Diagnosis not present

## 2018-03-11 DIAGNOSIS — M8589 Other specified disorders of bone density and structure, multiple sites: Secondary | ICD-10-CM | POA: Diagnosis not present

## 2018-03-11 DIAGNOSIS — E559 Vitamin D deficiency, unspecified: Secondary | ICD-10-CM | POA: Diagnosis not present

## 2018-03-11 DIAGNOSIS — E785 Hyperlipidemia, unspecified: Secondary | ICD-10-CM | POA: Diagnosis not present

## 2018-03-11 DIAGNOSIS — I10 Essential (primary) hypertension: Secondary | ICD-10-CM | POA: Diagnosis not present

## 2018-03-11 DIAGNOSIS — E1140 Type 2 diabetes mellitus with diabetic neuropathy, unspecified: Secondary | ICD-10-CM | POA: Diagnosis not present

## 2018-04-08 DIAGNOSIS — M9903 Segmental and somatic dysfunction of lumbar region: Secondary | ICD-10-CM | POA: Diagnosis not present

## 2018-04-08 DIAGNOSIS — M5136 Other intervertebral disc degeneration, lumbar region: Secondary | ICD-10-CM | POA: Diagnosis not present

## 2018-04-08 DIAGNOSIS — M9904 Segmental and somatic dysfunction of sacral region: Secondary | ICD-10-CM | POA: Diagnosis not present

## 2018-04-08 DIAGNOSIS — M5442 Lumbago with sciatica, left side: Secondary | ICD-10-CM | POA: Diagnosis not present

## 2018-04-08 DIAGNOSIS — M5137 Other intervertebral disc degeneration, lumbosacral region: Secondary | ICD-10-CM | POA: Diagnosis not present

## 2018-04-14 ENCOUNTER — Ambulatory Visit: Payer: PRIVATE HEALTH INSURANCE | Admitting: Podiatry

## 2018-04-29 DIAGNOSIS — M8589 Other specified disorders of bone density and structure, multiple sites: Secondary | ICD-10-CM | POA: Diagnosis not present

## 2018-04-29 DIAGNOSIS — M85851 Other specified disorders of bone density and structure, right thigh: Secondary | ICD-10-CM | POA: Diagnosis not present

## 2018-05-06 DIAGNOSIS — M542 Cervicalgia: Secondary | ICD-10-CM | POA: Diagnosis not present

## 2018-05-06 DIAGNOSIS — M546 Pain in thoracic spine: Secondary | ICD-10-CM | POA: Diagnosis not present

## 2018-05-06 DIAGNOSIS — M5412 Radiculopathy, cervical region: Secondary | ICD-10-CM | POA: Diagnosis not present

## 2018-05-11 DIAGNOSIS — R918 Other nonspecific abnormal finding of lung field: Secondary | ICD-10-CM | POA: Diagnosis not present

## 2018-05-12 DIAGNOSIS — H2513 Age-related nuclear cataract, bilateral: Secondary | ICD-10-CM | POA: Diagnosis not present

## 2018-05-20 DIAGNOSIS — M47812 Spondylosis without myelopathy or radiculopathy, cervical region: Secondary | ICD-10-CM | POA: Diagnosis not present

## 2018-05-20 DIAGNOSIS — R21 Rash and other nonspecific skin eruption: Secondary | ICD-10-CM | POA: Diagnosis not present

## 2018-05-20 DIAGNOSIS — M7541 Impingement syndrome of right shoulder: Secondary | ICD-10-CM | POA: Diagnosis not present

## 2018-05-20 DIAGNOSIS — M4722 Other spondylosis with radiculopathy, cervical region: Secondary | ICD-10-CM | POA: Diagnosis not present

## 2018-06-15 DIAGNOSIS — E559 Vitamin D deficiency, unspecified: Secondary | ICD-10-CM | POA: Diagnosis not present

## 2018-06-15 DIAGNOSIS — E785 Hyperlipidemia, unspecified: Secondary | ICD-10-CM | POA: Diagnosis not present

## 2018-06-15 DIAGNOSIS — E1140 Type 2 diabetes mellitus with diabetic neuropathy, unspecified: Secondary | ICD-10-CM | POA: Diagnosis not present

## 2018-06-17 DIAGNOSIS — I10 Essential (primary) hypertension: Secondary | ICD-10-CM | POA: Diagnosis not present

## 2018-06-17 DIAGNOSIS — E559 Vitamin D deficiency, unspecified: Secondary | ICD-10-CM | POA: Diagnosis not present

## 2018-06-17 DIAGNOSIS — E785 Hyperlipidemia, unspecified: Secondary | ICD-10-CM | POA: Diagnosis not present

## 2018-06-17 DIAGNOSIS — E1140 Type 2 diabetes mellitus with diabetic neuropathy, unspecified: Secondary | ICD-10-CM | POA: Diagnosis not present

## 2018-06-17 DIAGNOSIS — Z23 Encounter for immunization: Secondary | ICD-10-CM | POA: Diagnosis not present

## 2018-06-17 DIAGNOSIS — M47812 Spondylosis without myelopathy or radiculopathy, cervical region: Secondary | ICD-10-CM | POA: Diagnosis not present

## 2018-06-30 DIAGNOSIS — M542 Cervicalgia: Secondary | ICD-10-CM | POA: Diagnosis not present

## 2018-06-30 DIAGNOSIS — M47812 Spondylosis without myelopathy or radiculopathy, cervical region: Secondary | ICD-10-CM | POA: Diagnosis not present

## 2018-07-07 DIAGNOSIS — J069 Acute upper respiratory infection, unspecified: Secondary | ICD-10-CM | POA: Diagnosis not present

## 2018-07-09 DIAGNOSIS — H18413 Arcus senilis, bilateral: Secondary | ICD-10-CM | POA: Diagnosis not present

## 2018-07-09 DIAGNOSIS — H2513 Age-related nuclear cataract, bilateral: Secondary | ICD-10-CM | POA: Diagnosis not present

## 2018-07-09 DIAGNOSIS — H02834 Dermatochalasis of left upper eyelid: Secondary | ICD-10-CM | POA: Diagnosis not present

## 2018-07-09 DIAGNOSIS — H25013 Cortical age-related cataract, bilateral: Secondary | ICD-10-CM | POA: Diagnosis not present

## 2018-07-09 DIAGNOSIS — H2512 Age-related nuclear cataract, left eye: Secondary | ICD-10-CM | POA: Diagnosis not present

## 2018-07-15 DIAGNOSIS — Z139 Encounter for screening, unspecified: Secondary | ICD-10-CM | POA: Diagnosis not present

## 2018-07-15 DIAGNOSIS — E1140 Type 2 diabetes mellitus with diabetic neuropathy, unspecified: Secondary | ICD-10-CM | POA: Diagnosis not present

## 2018-07-15 DIAGNOSIS — R252 Cramp and spasm: Secondary | ICD-10-CM | POA: Diagnosis not present

## 2018-07-16 DIAGNOSIS — M47812 Spondylosis without myelopathy or radiculopathy, cervical region: Secondary | ICD-10-CM | POA: Diagnosis not present

## 2018-07-16 DIAGNOSIS — M7540 Impingement syndrome of unspecified shoulder: Secondary | ICD-10-CM | POA: Diagnosis not present

## 2018-07-30 DIAGNOSIS — H00014 Hordeolum externum left upper eyelid: Secondary | ICD-10-CM | POA: Diagnosis not present

## 2018-08-03 DIAGNOSIS — H00014 Hordeolum externum left upper eyelid: Secondary | ICD-10-CM | POA: Diagnosis not present

## 2018-08-13 DIAGNOSIS — H00014 Hordeolum externum left upper eyelid: Secondary | ICD-10-CM | POA: Diagnosis not present

## 2018-08-28 DIAGNOSIS — H02831 Dermatochalasis of right upper eyelid: Secondary | ICD-10-CM | POA: Diagnosis not present

## 2018-08-28 DIAGNOSIS — H2513 Age-related nuclear cataract, bilateral: Secondary | ICD-10-CM | POA: Diagnosis not present

## 2018-08-28 DIAGNOSIS — H18413 Arcus senilis, bilateral: Secondary | ICD-10-CM | POA: Diagnosis not present

## 2018-08-28 DIAGNOSIS — H0014 Chalazion left upper eyelid: Secondary | ICD-10-CM | POA: Diagnosis not present

## 2018-08-29 HISTORY — PX: CATARACT EXTRACTION: SUR2

## 2018-09-03 DIAGNOSIS — E1140 Type 2 diabetes mellitus with diabetic neuropathy, unspecified: Secondary | ICD-10-CM | POA: Diagnosis not present

## 2018-09-09 DIAGNOSIS — H2512 Age-related nuclear cataract, left eye: Secondary | ICD-10-CM | POA: Diagnosis not present

## 2018-09-10 DIAGNOSIS — H2511 Age-related nuclear cataract, right eye: Secondary | ICD-10-CM | POA: Diagnosis not present

## 2018-09-16 DIAGNOSIS — E1140 Type 2 diabetes mellitus with diabetic neuropathy, unspecified: Secondary | ICD-10-CM | POA: Diagnosis not present

## 2018-09-16 DIAGNOSIS — E785 Hyperlipidemia, unspecified: Secondary | ICD-10-CM | POA: Diagnosis not present

## 2018-09-16 DIAGNOSIS — E559 Vitamin D deficiency, unspecified: Secondary | ICD-10-CM | POA: Diagnosis not present

## 2018-09-18 DIAGNOSIS — I10 Essential (primary) hypertension: Secondary | ICD-10-CM | POA: Diagnosis not present

## 2018-09-18 DIAGNOSIS — E1140 Type 2 diabetes mellitus with diabetic neuropathy, unspecified: Secondary | ICD-10-CM | POA: Diagnosis not present

## 2018-09-18 DIAGNOSIS — E559 Vitamin D deficiency, unspecified: Secondary | ICD-10-CM | POA: Diagnosis not present

## 2018-09-18 DIAGNOSIS — E785 Hyperlipidemia, unspecified: Secondary | ICD-10-CM | POA: Diagnosis not present

## 2018-09-18 DIAGNOSIS — Z9181 History of falling: Secondary | ICD-10-CM | POA: Diagnosis not present

## 2018-09-23 DIAGNOSIS — H2511 Age-related nuclear cataract, right eye: Secondary | ICD-10-CM | POA: Diagnosis not present

## 2018-10-01 DIAGNOSIS — K219 Gastro-esophageal reflux disease without esophagitis: Secondary | ICD-10-CM | POA: Diagnosis not present

## 2018-10-01 DIAGNOSIS — E11319 Type 2 diabetes mellitus with unspecified diabetic retinopathy without macular edema: Secondary | ICD-10-CM | POA: Diagnosis not present

## 2018-10-01 DIAGNOSIS — K222 Esophageal obstruction: Secondary | ICD-10-CM | POA: Diagnosis not present

## 2018-11-05 DIAGNOSIS — M5137 Other intervertebral disc degeneration, lumbosacral region: Secondary | ICD-10-CM | POA: Diagnosis not present

## 2018-11-05 DIAGNOSIS — M9903 Segmental and somatic dysfunction of lumbar region: Secondary | ICD-10-CM | POA: Diagnosis not present

## 2018-11-05 DIAGNOSIS — M5136 Other intervertebral disc degeneration, lumbar region: Secondary | ICD-10-CM | POA: Diagnosis not present

## 2018-11-05 DIAGNOSIS — M5442 Lumbago with sciatica, left side: Secondary | ICD-10-CM | POA: Diagnosis not present

## 2018-11-06 DIAGNOSIS — M5136 Other intervertebral disc degeneration, lumbar region: Secondary | ICD-10-CM | POA: Diagnosis not present

## 2018-11-06 DIAGNOSIS — M5137 Other intervertebral disc degeneration, lumbosacral region: Secondary | ICD-10-CM | POA: Diagnosis not present

## 2018-11-06 DIAGNOSIS — M5442 Lumbago with sciatica, left side: Secondary | ICD-10-CM | POA: Diagnosis not present

## 2018-11-06 DIAGNOSIS — M9903 Segmental and somatic dysfunction of lumbar region: Secondary | ICD-10-CM | POA: Diagnosis not present

## 2018-11-09 DIAGNOSIS — M9903 Segmental and somatic dysfunction of lumbar region: Secondary | ICD-10-CM | POA: Diagnosis not present

## 2018-11-09 DIAGNOSIS — M5137 Other intervertebral disc degeneration, lumbosacral region: Secondary | ICD-10-CM | POA: Diagnosis not present

## 2018-11-09 DIAGNOSIS — M5136 Other intervertebral disc degeneration, lumbar region: Secondary | ICD-10-CM | POA: Diagnosis not present

## 2018-11-09 DIAGNOSIS — M5442 Lumbago with sciatica, left side: Secondary | ICD-10-CM | POA: Diagnosis not present

## 2018-11-12 DIAGNOSIS — M5136 Other intervertebral disc degeneration, lumbar region: Secondary | ICD-10-CM | POA: Diagnosis not present

## 2018-11-12 DIAGNOSIS — M5137 Other intervertebral disc degeneration, lumbosacral region: Secondary | ICD-10-CM | POA: Diagnosis not present

## 2018-11-12 DIAGNOSIS — M5442 Lumbago with sciatica, left side: Secondary | ICD-10-CM | POA: Diagnosis not present

## 2018-11-12 DIAGNOSIS — M9903 Segmental and somatic dysfunction of lumbar region: Secondary | ICD-10-CM | POA: Diagnosis not present

## 2018-11-16 DIAGNOSIS — M5442 Lumbago with sciatica, left side: Secondary | ICD-10-CM | POA: Diagnosis not present

## 2018-11-16 DIAGNOSIS — M5136 Other intervertebral disc degeneration, lumbar region: Secondary | ICD-10-CM | POA: Diagnosis not present

## 2018-11-16 DIAGNOSIS — M9903 Segmental and somatic dysfunction of lumbar region: Secondary | ICD-10-CM | POA: Diagnosis not present

## 2018-11-16 DIAGNOSIS — M5137 Other intervertebral disc degeneration, lumbosacral region: Secondary | ICD-10-CM | POA: Diagnosis not present

## 2018-11-17 DIAGNOSIS — M4727 Other spondylosis with radiculopathy, lumbosacral region: Secondary | ICD-10-CM | POA: Diagnosis not present

## 2018-11-17 DIAGNOSIS — E1140 Type 2 diabetes mellitus with diabetic neuropathy, unspecified: Secondary | ICD-10-CM | POA: Diagnosis not present

## 2018-11-18 DIAGNOSIS — M545 Low back pain: Secondary | ICD-10-CM | POA: Diagnosis not present

## 2018-11-20 DIAGNOSIS — M5442 Lumbago with sciatica, left side: Secondary | ICD-10-CM | POA: Diagnosis not present

## 2018-11-20 DIAGNOSIS — M5136 Other intervertebral disc degeneration, lumbar region: Secondary | ICD-10-CM | POA: Diagnosis not present

## 2018-11-20 DIAGNOSIS — M5137 Other intervertebral disc degeneration, lumbosacral region: Secondary | ICD-10-CM | POA: Diagnosis not present

## 2018-11-20 DIAGNOSIS — M9903 Segmental and somatic dysfunction of lumbar region: Secondary | ICD-10-CM | POA: Diagnosis not present

## 2018-11-25 DIAGNOSIS — M5136 Other intervertebral disc degeneration, lumbar region: Secondary | ICD-10-CM | POA: Diagnosis not present

## 2018-11-25 DIAGNOSIS — M9903 Segmental and somatic dysfunction of lumbar region: Secondary | ICD-10-CM | POA: Diagnosis not present

## 2018-11-25 DIAGNOSIS — M5442 Lumbago with sciatica, left side: Secondary | ICD-10-CM | POA: Diagnosis not present

## 2018-11-25 DIAGNOSIS — M5137 Other intervertebral disc degeneration, lumbosacral region: Secondary | ICD-10-CM | POA: Diagnosis not present

## 2018-12-02 DIAGNOSIS — M9903 Segmental and somatic dysfunction of lumbar region: Secondary | ICD-10-CM | POA: Diagnosis not present

## 2018-12-02 DIAGNOSIS — M5442 Lumbago with sciatica, left side: Secondary | ICD-10-CM | POA: Diagnosis not present

## 2018-12-02 DIAGNOSIS — M5136 Other intervertebral disc degeneration, lumbar region: Secondary | ICD-10-CM | POA: Diagnosis not present

## 2018-12-02 DIAGNOSIS — M5137 Other intervertebral disc degeneration, lumbosacral region: Secondary | ICD-10-CM | POA: Diagnosis not present

## 2018-12-16 DIAGNOSIS — M9903 Segmental and somatic dysfunction of lumbar region: Secondary | ICD-10-CM | POA: Diagnosis not present

## 2018-12-16 DIAGNOSIS — M5442 Lumbago with sciatica, left side: Secondary | ICD-10-CM | POA: Diagnosis not present

## 2018-12-16 DIAGNOSIS — M5136 Other intervertebral disc degeneration, lumbar region: Secondary | ICD-10-CM | POA: Diagnosis not present

## 2018-12-16 DIAGNOSIS — M5137 Other intervertebral disc degeneration, lumbosacral region: Secondary | ICD-10-CM | POA: Diagnosis not present

## 2018-12-22 DIAGNOSIS — M545 Low back pain: Secondary | ICD-10-CM | POA: Diagnosis not present

## 2018-12-24 DIAGNOSIS — M545 Low back pain: Secondary | ICD-10-CM | POA: Diagnosis not present

## 2018-12-24 DIAGNOSIS — M1611 Unilateral primary osteoarthritis, right hip: Secondary | ICD-10-CM | POA: Diagnosis not present

## 2018-12-24 DIAGNOSIS — R1031 Right lower quadrant pain: Secondary | ICD-10-CM | POA: Diagnosis not present

## 2018-12-30 DIAGNOSIS — E559 Vitamin D deficiency, unspecified: Secondary | ICD-10-CM | POA: Diagnosis not present

## 2018-12-30 DIAGNOSIS — E785 Hyperlipidemia, unspecified: Secondary | ICD-10-CM | POA: Diagnosis not present

## 2018-12-30 DIAGNOSIS — E1140 Type 2 diabetes mellitus with diabetic neuropathy, unspecified: Secondary | ICD-10-CM | POA: Diagnosis not present

## 2018-12-31 DIAGNOSIS — M545 Low back pain: Secondary | ICD-10-CM | POA: Diagnosis not present

## 2018-12-31 DIAGNOSIS — M5116 Intervertebral disc disorders with radiculopathy, lumbar region: Secondary | ICD-10-CM | POA: Diagnosis not present

## 2018-12-31 DIAGNOSIS — M5416 Radiculopathy, lumbar region: Secondary | ICD-10-CM | POA: Diagnosis not present

## 2019-01-04 DIAGNOSIS — E559 Vitamin D deficiency, unspecified: Secondary | ICD-10-CM | POA: Diagnosis not present

## 2019-01-04 DIAGNOSIS — R252 Cramp and spasm: Secondary | ICD-10-CM | POA: Diagnosis not present

## 2019-01-04 DIAGNOSIS — E1140 Type 2 diabetes mellitus with diabetic neuropathy, unspecified: Secondary | ICD-10-CM | POA: Diagnosis not present

## 2019-01-04 DIAGNOSIS — E785 Hyperlipidemia, unspecified: Secondary | ICD-10-CM | POA: Diagnosis not present

## 2019-01-04 DIAGNOSIS — E1165 Type 2 diabetes mellitus with hyperglycemia: Secondary | ICD-10-CM | POA: Diagnosis not present

## 2019-01-07 DIAGNOSIS — D235 Other benign neoplasm of skin of trunk: Secondary | ICD-10-CM | POA: Diagnosis not present

## 2019-01-07 DIAGNOSIS — L578 Other skin changes due to chronic exposure to nonionizing radiation: Secondary | ICD-10-CM | POA: Diagnosis not present

## 2019-01-07 DIAGNOSIS — L570 Actinic keratosis: Secondary | ICD-10-CM | POA: Diagnosis not present

## 2019-01-07 DIAGNOSIS — L219 Seborrheic dermatitis, unspecified: Secondary | ICD-10-CM | POA: Diagnosis not present

## 2019-01-07 DIAGNOSIS — L821 Other seborrheic keratosis: Secondary | ICD-10-CM | POA: Diagnosis not present

## 2019-01-18 DIAGNOSIS — M5136 Other intervertebral disc degeneration, lumbar region: Secondary | ICD-10-CM | POA: Diagnosis not present

## 2019-01-18 DIAGNOSIS — M5137 Other intervertebral disc degeneration, lumbosacral region: Secondary | ICD-10-CM | POA: Diagnosis not present

## 2019-01-18 DIAGNOSIS — M9903 Segmental and somatic dysfunction of lumbar region: Secondary | ICD-10-CM | POA: Diagnosis not present

## 2019-01-18 DIAGNOSIS — M5442 Lumbago with sciatica, left side: Secondary | ICD-10-CM | POA: Diagnosis not present

## 2019-02-05 DIAGNOSIS — M545 Low back pain: Secondary | ICD-10-CM | POA: Diagnosis not present

## 2019-02-23 DIAGNOSIS — M47817 Spondylosis without myelopathy or radiculopathy, lumbosacral region: Secondary | ICD-10-CM | POA: Diagnosis not present

## 2019-02-23 DIAGNOSIS — M545 Low back pain: Secondary | ICD-10-CM | POA: Diagnosis not present

## 2019-04-02 DIAGNOSIS — E785 Hyperlipidemia, unspecified: Secondary | ICD-10-CM | POA: Diagnosis not present

## 2019-04-02 DIAGNOSIS — E559 Vitamin D deficiency, unspecified: Secondary | ICD-10-CM | POA: Diagnosis not present

## 2019-04-02 DIAGNOSIS — H00012 Hordeolum externum right lower eyelid: Secondary | ICD-10-CM | POA: Diagnosis not present

## 2019-04-02 DIAGNOSIS — E1140 Type 2 diabetes mellitus with diabetic neuropathy, unspecified: Secondary | ICD-10-CM | POA: Diagnosis not present

## 2019-04-09 DIAGNOSIS — E1142 Type 2 diabetes mellitus with diabetic polyneuropathy: Secondary | ICD-10-CM | POA: Diagnosis not present

## 2019-04-09 DIAGNOSIS — E785 Hyperlipidemia, unspecified: Secondary | ICD-10-CM | POA: Diagnosis not present

## 2019-04-09 DIAGNOSIS — E1165 Type 2 diabetes mellitus with hyperglycemia: Secondary | ICD-10-CM | POA: Diagnosis not present

## 2019-04-09 DIAGNOSIS — E559 Vitamin D deficiency, unspecified: Secondary | ICD-10-CM | POA: Diagnosis not present

## 2019-04-09 DIAGNOSIS — Z1231 Encounter for screening mammogram for malignant neoplasm of breast: Secondary | ICD-10-CM | POA: Diagnosis not present

## 2019-04-09 DIAGNOSIS — I10 Essential (primary) hypertension: Secondary | ICD-10-CM | POA: Diagnosis not present

## 2019-04-22 DIAGNOSIS — E1142 Type 2 diabetes mellitus with diabetic polyneuropathy: Secondary | ICD-10-CM | POA: Diagnosis not present

## 2019-04-22 DIAGNOSIS — E1165 Type 2 diabetes mellitus with hyperglycemia: Secondary | ICD-10-CM | POA: Diagnosis not present

## 2019-04-22 DIAGNOSIS — H9192 Unspecified hearing loss, left ear: Secondary | ICD-10-CM | POA: Diagnosis not present

## 2019-04-22 DIAGNOSIS — Z23 Encounter for immunization: Secondary | ICD-10-CM | POA: Diagnosis not present

## 2019-04-22 DIAGNOSIS — H6123 Impacted cerumen, bilateral: Secondary | ICD-10-CM | POA: Diagnosis not present

## 2019-04-27 DIAGNOSIS — M7540 Impingement syndrome of unspecified shoulder: Secondary | ICD-10-CM | POA: Diagnosis not present

## 2019-04-27 DIAGNOSIS — M47812 Spondylosis without myelopathy or radiculopathy, cervical region: Secondary | ICD-10-CM | POA: Diagnosis not present

## 2019-05-13 DIAGNOSIS — E785 Hyperlipidemia, unspecified: Secondary | ICD-10-CM | POA: Diagnosis not present

## 2019-05-13 DIAGNOSIS — Z9181 History of falling: Secondary | ICD-10-CM | POA: Diagnosis not present

## 2019-05-13 DIAGNOSIS — Z1231 Encounter for screening mammogram for malignant neoplasm of breast: Secondary | ICD-10-CM | POA: Diagnosis not present

## 2019-05-13 DIAGNOSIS — Z0000 Encounter for general adult medical examination without abnormal findings: Secondary | ICD-10-CM | POA: Diagnosis not present

## 2019-06-14 ENCOUNTER — Ambulatory Visit (INDEPENDENT_AMBULATORY_CARE_PROVIDER_SITE_OTHER): Payer: Medicare Other

## 2019-06-14 ENCOUNTER — Other Ambulatory Visit: Payer: Self-pay | Admitting: Podiatry

## 2019-06-14 ENCOUNTER — Other Ambulatory Visit: Payer: Self-pay

## 2019-06-14 ENCOUNTER — Ambulatory Visit: Payer: Medicare Other | Admitting: Podiatry

## 2019-06-14 DIAGNOSIS — M205X2 Other deformities of toe(s) (acquired), left foot: Secondary | ICD-10-CM

## 2019-06-14 DIAGNOSIS — M2011 Hallux valgus (acquired), right foot: Secondary | ICD-10-CM | POA: Diagnosis not present

## 2019-06-14 DIAGNOSIS — M2041 Other hammer toe(s) (acquired), right foot: Secondary | ICD-10-CM

## 2019-06-14 DIAGNOSIS — M205X1 Other deformities of toe(s) (acquired), right foot: Secondary | ICD-10-CM | POA: Diagnosis not present

## 2019-06-14 DIAGNOSIS — M2042 Other hammer toe(s) (acquired), left foot: Secondary | ICD-10-CM

## 2019-06-14 DIAGNOSIS — S99922S Unspecified injury of left foot, sequela: Secondary | ICD-10-CM

## 2019-06-14 DIAGNOSIS — M2012 Hallux valgus (acquired), left foot: Secondary | ICD-10-CM

## 2019-06-14 NOTE — Patient Instructions (Signed)
Hammer Toe  Hammer toe is a change in the shape (a deformity) of your toe. The deformity causes the middle joint of your toe to stay bent. This causes pain, especially when you are wearing shoes. Hammer toe starts gradually. At first, the toe can be straightened. Gradually over time, the deformity becomes stiff and permanent. Early treatments to keep the toe straight may relieve pain. As the deformity becomes stiff and permanent, surgery may be needed to straighten the toe. What are the causes? Hammer toe is caused by abnormal bending of the toe joint that is closest to your foot. It happens gradually over time. This pulls on the muscles and connections (tendons) of the toe joint, making them weak and stiff. It is often related to wearing shoes that are too short or narrow and do not let your toes straighten. What increases the risk? You may be at greater risk for hammer toe if you:  Are female.  Are older.  Wear shoes that are too small.  Wear high-heeled shoes that pinch your toes.  Are a ballet dancer.  Have a second toe that is longer than your big toe (first toe).  Injure your foot or toe.  Have arthritis.  Have a family history of hammer toe.  Have a nerve or muscle disorder. What are the signs or symptoms? The main symptoms of this condition are pain and deformity of the toe. The pain is worse when wearing shoes, walking, or running. Other symptoms may include:  Corns or calluses over the bent part of the toe or between the toes.  Redness and a burning feeling on the toe.  An open sore that forms on the top of the toe.  Not being able to straighten the toe. How is this diagnosed? This condition is diagnosed based on your symptoms and a physical exam. During the exam, your health care provider will try to straighten your toe to see how stiff the deformity is. You may also have tests, such as:  A blood test to check for rheumatoid arthritis.  An X-ray to show how  severe the deformity is. How is this treated? Treatment for this condition will depend on how stiff the deformity is. Surgery is often needed. However, sometimes a hammer toe can be straightened without surgery. Treatments that do not involve surgery include:  Taping the toe into a straightened position.  Using pads and cushions to protect the toe (orthotics).  Wearing shoes that provide enough room for the toes.  Doing toe-stretching exercises at home.  Taking an NSAID to reduce pain and swelling. If these treatments do not help or the toe cannot be straightened, surgery is the next option. The most common surgeries used to straighten a hammer toe include:  Arthroplasty. In this procedure, part of the joint is removed, and that allows the toe to straighten.  Fusion. In this procedure, cartilage between the two bones of the joint is taken out and the bones are fused together into one longer bone.  Implantation. In this procedure, part of the bone is removed and replaced with an implant to let the toe move again.  Flexor tendon transfer. In this procedure, the tendons that curl the toes down (flexor tendons) are repositioned. Follow these instructions at home:  Take over-the-counter and prescription medicines only as told by your health care provider.  Do toe straightening and stretching exercises as told by your health care provider.  Keep all follow-up visits as told by your health care   provider. This is important. How is this prevented?  Wear shoes that give your toes enough room and do not cause pain.  Do not wear high-heeled shoes. Contact a health care provider if:  Your pain gets worse.  Your toe becomes red or swollen.  You develop an open sore on your toe. This information is not intended to replace advice given to you by your health care provider. Make sure you discuss any questions you have with your health care provider. Document Released: 07/12/2000 Document  Revised: 06/27/2017 Document Reviewed: 11/08/2015 Elsevier Patient Education  2020 [employer]  

## 2019-06-14 NOTE — Progress Notes (Signed)
  Subjective:  Patient ID: Casey Watkins, female    DOB: 12/03/1945,  MRN: [id number]  Chief Complaint  Patient presents with  . Hammer Toe    Lt 2nd toe abnormal curving x years; 7-8/10 pain -getting wose -wose with shoes -pt states she can't bend her toes -w/ stiffness and tingling at toes Tx: none -pt alos c/o Rt 2nd HT   . Diabetes    FBS: 150 A1C: 7    73 y.o. female presents with the above complaint. Hx above confirmed with paitent. Chronic issues with the 2nd toe left >right.   Review of Systems: Negative except as noted in the HPI. Denies N/V/F/Ch.  No past medical history on file. No current outpatient medications on file.  Social History   Tobacco Use  Smoking Status Not on file    Allergies  Allergen Reactions  . Shellfish Allergy Shortness Of Breath    shrimp   Objective:  There were no vitals filed for this visit. There is no height or weight on file to calculate BMI. Constitutional Well developed. Well nourished.  Vascular Dorsalis pedis pulses palpable bilaterally. Posterior tibial pulses palpable bilaterally. Capillary refill normal to all digits.  No cyanosis or clubbing noted. Pedal hair growth normal.  Neurologic Normal speech. Oriented to person, place, and time. Epicritic sensation to light touch grossly present bilaterally.  Dermatologic Nails well groomed and normal in appearance. No open wounds. No skin lesions.  Orthopedic: POP left 2nd MPJ. Medial crossover 2nd digit bilat. Mild HAV deformity   Radiographs: Taken and reviewed no acute fracture elongated 2nd metatarsal severe medial deviation of 2nd toe Assessment:   1. Hammer toes of both feet   2. Acquired hallux valgus of both feet   3. Crossover toe deformity of left foot   4. Crossover toe deformity of right foot   5. Plantar plate injury, left, sequela    Plan:  Patient was evaluated and treated and all questions answered.  Hammertoe Left 2nd toe, plantar plate rupture -Educated  on etiology -Hammertoe crest pad dispensed. -Should pain persist would consider surgical intervention. Would need HAV correction, 2nd metatarsal Weil osteotomy, 2nd HT repair, plantar plate repair.  Return in about 6 weeks (around 07/26/2019) for Hammertoe left 2nd toe f/u.

## 2019-06-29 DIAGNOSIS — Z1231 Encounter for screening mammogram for malignant neoplasm of breast: Secondary | ICD-10-CM | POA: Diagnosis not present

## 2019-07-12 DIAGNOSIS — L821 Other seborrheic keratosis: Secondary | ICD-10-CM | POA: Diagnosis not present

## 2019-07-12 DIAGNOSIS — C44319 Basal cell carcinoma of skin of other parts of face: Secondary | ICD-10-CM | POA: Diagnosis not present

## 2019-07-12 DIAGNOSIS — L578 Other skin changes due to chronic exposure to nonionizing radiation: Secondary | ICD-10-CM | POA: Diagnosis not present

## 2019-07-12 DIAGNOSIS — L570 Actinic keratosis: Secondary | ICD-10-CM | POA: Diagnosis not present

## 2019-07-14 DIAGNOSIS — E559 Vitamin D deficiency, unspecified: Secondary | ICD-10-CM | POA: Diagnosis not present

## 2019-07-14 DIAGNOSIS — E785 Hyperlipidemia, unspecified: Secondary | ICD-10-CM | POA: Diagnosis not present

## 2019-07-14 DIAGNOSIS — E1142 Type 2 diabetes mellitus with diabetic polyneuropathy: Secondary | ICD-10-CM | POA: Diagnosis not present

## 2019-07-16 DIAGNOSIS — E1165 Type 2 diabetes mellitus with hyperglycemia: Secondary | ICD-10-CM | POA: Diagnosis not present

## 2019-07-16 DIAGNOSIS — E785 Hyperlipidemia, unspecified: Secondary | ICD-10-CM | POA: Diagnosis not present

## 2019-07-16 DIAGNOSIS — E559 Vitamin D deficiency, unspecified: Secondary | ICD-10-CM | POA: Diagnosis not present

## 2019-07-16 DIAGNOSIS — E1142 Type 2 diabetes mellitus with diabetic polyneuropathy: Secondary | ICD-10-CM | POA: Diagnosis not present

## 2019-07-16 DIAGNOSIS — I10 Essential (primary) hypertension: Secondary | ICD-10-CM | POA: Diagnosis not present

## 2019-07-26 ENCOUNTER — Ambulatory Visit: Payer: Medicare Other | Admitting: Podiatry

## 2019-07-26 DIAGNOSIS — E1142 Type 2 diabetes mellitus with diabetic polyneuropathy: Secondary | ICD-10-CM | POA: Diagnosis not present

## 2019-08-05 DIAGNOSIS — M9904 Segmental and somatic dysfunction of sacral region: Secondary | ICD-10-CM | POA: Diagnosis not present

## 2019-08-05 DIAGNOSIS — M5136 Other intervertebral disc degeneration, lumbar region: Secondary | ICD-10-CM | POA: Diagnosis not present

## 2019-08-05 DIAGNOSIS — M9903 Segmental and somatic dysfunction of lumbar region: Secondary | ICD-10-CM | POA: Diagnosis not present

## 2019-08-09 DIAGNOSIS — M5136 Other intervertebral disc degeneration, lumbar region: Secondary | ICD-10-CM | POA: Diagnosis not present

## 2019-08-09 DIAGNOSIS — M9903 Segmental and somatic dysfunction of lumbar region: Secondary | ICD-10-CM | POA: Diagnosis not present

## 2019-08-11 DIAGNOSIS — M9903 Segmental and somatic dysfunction of lumbar region: Secondary | ICD-10-CM | POA: Diagnosis not present

## 2019-08-11 DIAGNOSIS — M5136 Other intervertebral disc degeneration, lumbar region: Secondary | ICD-10-CM | POA: Diagnosis not present

## 2019-08-16 DIAGNOSIS — M9903 Segmental and somatic dysfunction of lumbar region: Secondary | ICD-10-CM | POA: Diagnosis not present

## 2019-08-16 DIAGNOSIS — M5136 Other intervertebral disc degeneration, lumbar region: Secondary | ICD-10-CM | POA: Diagnosis not present

## 2019-08-19 DIAGNOSIS — M9903 Segmental and somatic dysfunction of lumbar region: Secondary | ICD-10-CM | POA: Diagnosis not present

## 2019-08-19 DIAGNOSIS — M5136 Other intervertebral disc degeneration, lumbar region: Secondary | ICD-10-CM | POA: Diagnosis not present

## 2019-09-09 DIAGNOSIS — M25551 Pain in right hip: Secondary | ICD-10-CM

## 2019-09-09 DIAGNOSIS — M5416 Radiculopathy, lumbar region: Secondary | ICD-10-CM | POA: Diagnosis not present

## 2019-09-09 HISTORY — DX: Pain in right hip: M25.551

## 2019-09-13 DIAGNOSIS — M545 Low back pain: Secondary | ICD-10-CM | POA: Diagnosis not present

## 2019-09-13 DIAGNOSIS — M5416 Radiculopathy, lumbar region: Secondary | ICD-10-CM | POA: Diagnosis not present

## 2019-09-13 DIAGNOSIS — M461 Sacroiliitis, not elsewhere classified: Secondary | ICD-10-CM | POA: Diagnosis not present

## 2019-09-13 DIAGNOSIS — M7061 Trochanteric bursitis, right hip: Secondary | ICD-10-CM | POA: Diagnosis not present

## 2019-09-20 DIAGNOSIS — M7061 Trochanteric bursitis, right hip: Secondary | ICD-10-CM | POA: Diagnosis not present

## 2019-09-20 DIAGNOSIS — M5416 Radiculopathy, lumbar region: Secondary | ICD-10-CM | POA: Diagnosis not present

## 2019-09-20 DIAGNOSIS — M461 Sacroiliitis, not elsewhere classified: Secondary | ICD-10-CM | POA: Diagnosis not present

## 2019-09-20 DIAGNOSIS — M545 Low back pain: Secondary | ICD-10-CM | POA: Diagnosis not present

## 2019-09-23 DIAGNOSIS — M461 Sacroiliitis, not elsewhere classified: Secondary | ICD-10-CM | POA: Diagnosis not present

## 2019-09-23 DIAGNOSIS — M545 Low back pain: Secondary | ICD-10-CM | POA: Diagnosis not present

## 2019-09-23 DIAGNOSIS — M7061 Trochanteric bursitis, right hip: Secondary | ICD-10-CM | POA: Diagnosis not present

## 2019-09-23 DIAGNOSIS — M5416 Radiculopathy, lumbar region: Secondary | ICD-10-CM | POA: Diagnosis not present

## 2019-09-27 DIAGNOSIS — M461 Sacroiliitis, not elsewhere classified: Secondary | ICD-10-CM | POA: Diagnosis not present

## 2019-09-27 DIAGNOSIS — M5416 Radiculopathy, lumbar region: Secondary | ICD-10-CM | POA: Diagnosis not present

## 2019-09-27 DIAGNOSIS — M7061 Trochanteric bursitis, right hip: Secondary | ICD-10-CM | POA: Diagnosis not present

## 2019-09-27 DIAGNOSIS — M545 Low back pain: Secondary | ICD-10-CM | POA: Diagnosis not present

## 2019-10-04 DIAGNOSIS — M461 Sacroiliitis, not elsewhere classified: Secondary | ICD-10-CM | POA: Diagnosis not present

## 2019-10-04 DIAGNOSIS — M5416 Radiculopathy, lumbar region: Secondary | ICD-10-CM | POA: Diagnosis not present

## 2019-10-04 DIAGNOSIS — M545 Low back pain: Secondary | ICD-10-CM | POA: Diagnosis not present

## 2019-10-04 DIAGNOSIS — M7061 Trochanteric bursitis, right hip: Secondary | ICD-10-CM | POA: Diagnosis not present

## 2019-10-11 DIAGNOSIS — M461 Sacroiliitis, not elsewhere classified: Secondary | ICD-10-CM | POA: Diagnosis not present

## 2019-10-11 DIAGNOSIS — M7061 Trochanteric bursitis, right hip: Secondary | ICD-10-CM | POA: Diagnosis not present

## 2019-10-11 DIAGNOSIS — M545 Low back pain: Secondary | ICD-10-CM | POA: Diagnosis not present

## 2019-10-11 DIAGNOSIS — M5416 Radiculopathy, lumbar region: Secondary | ICD-10-CM | POA: Diagnosis not present

## 2019-10-13 DIAGNOSIS — E559 Vitamin D deficiency, unspecified: Secondary | ICD-10-CM | POA: Diagnosis not present

## 2019-10-13 DIAGNOSIS — E785 Hyperlipidemia, unspecified: Secondary | ICD-10-CM | POA: Diagnosis not present

## 2019-10-13 DIAGNOSIS — E1142 Type 2 diabetes mellitus with diabetic polyneuropathy: Secondary | ICD-10-CM | POA: Diagnosis not present

## 2019-10-14 DIAGNOSIS — I10 Essential (primary) hypertension: Secondary | ICD-10-CM | POA: Diagnosis not present

## 2019-10-14 DIAGNOSIS — E1165 Type 2 diabetes mellitus with hyperglycemia: Secondary | ICD-10-CM | POA: Diagnosis not present

## 2019-10-14 DIAGNOSIS — E1142 Type 2 diabetes mellitus with diabetic polyneuropathy: Secondary | ICD-10-CM | POA: Diagnosis not present

## 2019-10-14 DIAGNOSIS — E559 Vitamin D deficiency, unspecified: Secondary | ICD-10-CM | POA: Diagnosis not present

## 2019-10-14 DIAGNOSIS — E785 Hyperlipidemia, unspecified: Secondary | ICD-10-CM | POA: Diagnosis not present

## 2019-10-28 DIAGNOSIS — M545 Low back pain: Secondary | ICD-10-CM | POA: Diagnosis not present

## 2019-10-28 DIAGNOSIS — M5136 Other intervertebral disc degeneration, lumbar region: Secondary | ICD-10-CM | POA: Diagnosis not present

## 2019-11-08 DIAGNOSIS — M7061 Trochanteric bursitis, right hip: Secondary | ICD-10-CM | POA: Diagnosis not present

## 2019-11-08 DIAGNOSIS — M545 Low back pain: Secondary | ICD-10-CM | POA: Diagnosis not present

## 2019-11-08 DIAGNOSIS — M5416 Radiculopathy, lumbar region: Secondary | ICD-10-CM | POA: Diagnosis not present

## 2019-11-08 DIAGNOSIS — M461 Sacroiliitis, not elsewhere classified: Secondary | ICD-10-CM | POA: Diagnosis not present

## 2019-11-15 DIAGNOSIS — M5416 Radiculopathy, lumbar region: Secondary | ICD-10-CM | POA: Diagnosis not present

## 2019-11-15 DIAGNOSIS — M461 Sacroiliitis, not elsewhere classified: Secondary | ICD-10-CM | POA: Diagnosis not present

## 2019-11-15 DIAGNOSIS — M7061 Trochanteric bursitis, right hip: Secondary | ICD-10-CM | POA: Diagnosis not present

## 2019-11-15 DIAGNOSIS — M545 Low back pain: Secondary | ICD-10-CM | POA: Diagnosis not present

## 2019-11-22 DIAGNOSIS — M545 Low back pain: Secondary | ICD-10-CM | POA: Diagnosis not present

## 2019-11-22 DIAGNOSIS — M5416 Radiculopathy, lumbar region: Secondary | ICD-10-CM | POA: Diagnosis not present

## 2019-11-22 DIAGNOSIS — M461 Sacroiliitis, not elsewhere classified: Secondary | ICD-10-CM | POA: Diagnosis not present

## 2019-11-22 DIAGNOSIS — M7061 Trochanteric bursitis, right hip: Secondary | ICD-10-CM | POA: Diagnosis not present

## 2019-11-29 DIAGNOSIS — M5416 Radiculopathy, lumbar region: Secondary | ICD-10-CM | POA: Diagnosis not present

## 2019-11-29 DIAGNOSIS — M7061 Trochanteric bursitis, right hip: Secondary | ICD-10-CM | POA: Diagnosis not present

## 2019-11-29 DIAGNOSIS — M461 Sacroiliitis, not elsewhere classified: Secondary | ICD-10-CM | POA: Diagnosis not present

## 2019-11-29 DIAGNOSIS — M545 Low back pain: Secondary | ICD-10-CM | POA: Diagnosis not present

## 2020-01-11 DIAGNOSIS — L821 Other seborrheic keratosis: Secondary | ICD-10-CM | POA: Diagnosis not present

## 2020-01-11 DIAGNOSIS — L570 Actinic keratosis: Secondary | ICD-10-CM | POA: Diagnosis not present

## 2020-01-14 DIAGNOSIS — E559 Vitamin D deficiency, unspecified: Secondary | ICD-10-CM | POA: Diagnosis not present

## 2020-01-14 DIAGNOSIS — E1142 Type 2 diabetes mellitus with diabetic polyneuropathy: Secondary | ICD-10-CM | POA: Diagnosis not present

## 2020-01-14 DIAGNOSIS — E785 Hyperlipidemia, unspecified: Secondary | ICD-10-CM | POA: Diagnosis not present

## 2020-01-18 DIAGNOSIS — I10 Essential (primary) hypertension: Secondary | ICD-10-CM | POA: Diagnosis not present

## 2020-01-18 DIAGNOSIS — E1165 Type 2 diabetes mellitus with hyperglycemia: Secondary | ICD-10-CM | POA: Diagnosis not present

## 2020-01-18 DIAGNOSIS — E1142 Type 2 diabetes mellitus with diabetic polyneuropathy: Secondary | ICD-10-CM | POA: Diagnosis not present

## 2020-01-18 DIAGNOSIS — Z139 Encounter for screening, unspecified: Secondary | ICD-10-CM | POA: Diagnosis not present

## 2020-01-18 DIAGNOSIS — R079 Chest pain, unspecified: Secondary | ICD-10-CM | POA: Diagnosis not present

## 2020-01-18 DIAGNOSIS — E785 Hyperlipidemia, unspecified: Secondary | ICD-10-CM | POA: Diagnosis not present

## 2020-02-02 ENCOUNTER — Ambulatory Visit: Payer: Medicare Other | Admitting: Cardiology

## 2020-02-02 ENCOUNTER — Encounter: Payer: Self-pay | Admitting: Cardiology

## 2020-02-02 ENCOUNTER — Other Ambulatory Visit: Payer: Self-pay

## 2020-02-02 ENCOUNTER — Ambulatory Visit: Payer: Medicare Other

## 2020-02-02 DIAGNOSIS — E669 Obesity, unspecified: Secondary | ICD-10-CM | POA: Insufficient documentation

## 2020-02-02 DIAGNOSIS — R072 Precordial pain: Secondary | ICD-10-CM

## 2020-02-02 DIAGNOSIS — R011 Cardiac murmur, unspecified: Secondary | ICD-10-CM | POA: Insufficient documentation

## 2020-02-02 DIAGNOSIS — E088 Diabetes mellitus due to underlying condition with unspecified complications: Secondary | ICD-10-CM | POA: Diagnosis not present

## 2020-02-02 DIAGNOSIS — I10 Essential (primary) hypertension: Secondary | ICD-10-CM

## 2020-02-02 DIAGNOSIS — E119 Type 2 diabetes mellitus without complications: Secondary | ICD-10-CM | POA: Insufficient documentation

## 2020-02-02 DIAGNOSIS — R002 Palpitations: Secondary | ICD-10-CM

## 2020-02-02 DIAGNOSIS — E782 Mixed hyperlipidemia: Secondary | ICD-10-CM

## 2020-02-02 DIAGNOSIS — R079 Chest pain, unspecified: Secondary | ICD-10-CM

## 2020-02-02 DIAGNOSIS — E1169 Type 2 diabetes mellitus with other specified complication: Secondary | ICD-10-CM | POA: Insufficient documentation

## 2020-02-02 HISTORY — DX: Essential (primary) hypertension: I10

## 2020-02-02 HISTORY — DX: Chest pain, unspecified: R07.9

## 2020-02-02 HISTORY — DX: Palpitations: R00.2

## 2020-02-02 HISTORY — DX: Cardiac murmur, unspecified: R01.1

## 2020-02-02 HISTORY — DX: Mixed hyperlipidemia: E78.2

## 2020-02-02 HISTORY — DX: Type 2 diabetes mellitus with other specified complication: E11.69

## 2020-02-02 NOTE — Patient Instructions (Signed)
Medication Instructions:  No medication changes. *If you need a refill on your cardiac medications before your next appointment, please call your pharmacy*   Lab Work: None ordered If you have labs (blood work) drawn today and your tests are completely normal, you will receive your results only by: Marland Kitchen MyChart Message (if you have MyChart) OR . A paper copy in the mail If you have any lab test that is abnormal or we need to change your treatment, we will call you to review the results.   Testing/Procedures: Your physician has requested that you have an echocardiogram. Echocardiography is a painless test that uses sound waves to create images of your heart. It provides your doctor with information about the size and shape of your heart and how well your heart's chambers and valves are working. This procedure takes approximately one hour. There are no restrictions for this procedure.  Your physician has requested that you have a lexiscan myoview. For further information please visit [email or web address]. Please follow instruction sheet, as given.  The test will take approximately 3 to 4 hours to complete; you may bring reading material.  If someone comes with you to your appointment, they will need to remain in the main lobby due to limited space in the testing area. **If you are pregnant or breastfeeding, please notify the nuclear lab prior to your appointment**  How to prepare for your Myocardial Perfusion Test: . Do not eat or drink 3 hours prior to your test, except you may have water. . Do not consume products containing caffeine (regular or decaffeinated) 12 hours prior to your test. (ex: coffee, chocolate, sodas, tea). . Do bring a list of your current medications with you.  If not listed below, you may take your medications as normal. . Do wear comfortable clothes (no dresses or overalls) and walking shoes, tennis shoes preferred (No heels or open toe shoes are allowed). . Do NOT wear  cologne, perfume, aftershave, or lotions (deodorant is allowed). . If these instructions are not followed, your test will have to be rescheduled.  WHY IS MY DOCTOR PRESCRIBING ZIO? The Zio system is proven and trusted by physicians to detect and diagnose irregular heart rhythms -- and has been prescribed to hundreds of thousands of patients.  The FDA has cleared the Zio system to monitor for many different kinds of irregular heart rhythms. In a study, physicians were able to reach a diagnosis 90% of the time with the Zio system1.  You can wear the Zio monitor -- a small, discreet, comfortable patch -- during your normal day-to-day activity, including while you sleep, shower, and exercise, while it records every single heartbeat for analysis.  1Barrett, P., et al. Comparison of 24 Hour Holter Monitoring Versus 14 Day Novel Adhesive Patch Electrocardiographic Monitoring. [place], 2014.  ZIO VS. HOLTER MONITORING The Zio monitor can be comfortably worn for up to 14 days. Holter monitors can be worn for 24 to 48 hours, limiting the time to record any irregular heart rhythms you may have. Zio is able to capture data for the 51% of patients who have their first symptom-triggered arrhythmia after 48 hours.1  LIVE WITHOUT RESTRICTIONS The Zio ambulatory cardiac monitor is a small, unobtrusive, and water-resistant patch--you might even forget you're wearing it. The Zio monitor records and stores every beat of your heart, whether you're sleeping, working out, or showering.  Wear the monitor for 2 weeks, remove 02/16/20.   Follow-Up: At [facility], you and your health  needs are our priority.  As part of our continuing mission to provide you with exceptional heart care, we have created designated Provider Care Teams.  These Care Teams include your primary Cardiologist (physician) and Advanced Practice Providers (APPs -  Physician Assistants and Nurse Practitioners) who all work  together to provide you with the care you need, when you need it.  We recommend signing up for the patient portal called "MyChart".  Sign up information is provided on this After Visit Summary.  MyChart is used to connect with patients for Virtual Visits (Telemedicine).  Patients are able to view lab/test results, encounter notes, upcoming appointments, etc.  Non-urgent messages can be sent to your provider as well.   To learn more about what you can do with MyChart, go to [email or web address].    Your next appointment:   2 month(s)  The format for your next appointment:   In Person  Provider:   Jyl Heinz, MD   Other Instructions  Nuclear Medicine Exam A nuclear medicine exam is a safe and painless imaging test. It helps your health care provider detect and diagnose diseases. It also provides information about the ways your organs work and how they are structured. For a nuclear medicine exam, you will be given a radioactive tracer. This substance is absorbed by your body's organs. A large scanning machine detects the tracer and creates pictures of the areas that your health care provider wants to know more about. There are several kinds of nuclear medicine exams. They include the following:  CT scan.  MRI scan.  PET scan.  SPECT scan. Tell your health care provider about:  Any allergies you have.  All medicines you are taking, including vitamins, herbs, eye drops, creams, and over-the-counter medicines.  Any problems you or family members have had with anesthetic medicines.  Any blood disorders you have.  Any surgeries you have had.  Any medical conditions you have.  Whether you are pregnant or may be pregnant.  Whether you are nursing. What are the risks? Generally, this is a safe procedure. However, problems may occur, such as an allergic reaction to the tracer, but this is rare. What happens before the procedure? Medicines Ask your health care provider  about:  Changing or stopping your regular medicines. This is especially important if you are taking diabetes medicines or blood thinners.  Taking medicines such as aspirin and ibuprofen. These medicines can thin your blood. Do not take these medicines unless your health care provider tells you to take them.  Taking over-the-counter medicines, vitamins, herbs, and supplements. General instructions  Follow instructions from your health care provider about eating or drinking restrictions.  Do not wear jewelry.  Wear loose, comfortable clothing. You may be asked to wear a hospital gown for the procedure.  Bring previous imaging studies, such as X-rays, with you to the exam if they are available. What happens during the procedure?   An IV may be inserted into one of your veins.  You will be asked to lie on a table or sit in a chair.  You will be given the radioactive tracer. You may get: ? A pill or liquid to swallow. ? An injection. ? Medicine through your IV. ? A gas to inhale.  A large scanning machine will be used to create images of your body. After the pictures are taken, you may have to wait so your health care provider can make sure that enough images were taken. The procedure may vary among  health care providers and hospitals. What happens after the procedure?  You may go home after the procedure and return to your usual activities, unless your health care provider tells you otherwise.  Drink enough water to keep your urine pale yellow. This helps to remove the radioactive tracer from your body.  It is up to you to get the results of your procedure. Ask your health care provider, or the department that is doing the procedure, when your results will be ready.  Get help right away if you have problems breathing. Summary  A nuclear medicine exam is a safe and painless imaging test that provides information about how your organs are working. It is also used to detect and  diagnose diseases of various body organs.  Follow your health care provider's instructions about eating and drinking restrictions. Ask whether you should change or stop any medicines.  During the procedure, you will be given a radioactive tracer. A large scanning machine will create images of your body.  You may go home after the procedure and return to your regular activities. Follow your health care provider's instructions.  Get help right away if you have problems breathing. This information is not intended to replace advice given to you by your health care provider. Make sure you discuss any questions you have with your health care provider. Document Revised: 06/03/2018 Document Reviewed: 06/03/2018 Elsevier Patient Education  [place].  Echocardiogram An echocardiogram is a procedure that uses painless sound waves (ultrasound) to produce an image of the heart. Images from an echocardiogram can provide important information about:  Signs of coronary artery disease (CAD).  Aneurysm detection. An aneurysm is a weak or damaged part of an artery wall that bulges out from the normal force of blood pumping through the body.  Heart size and shape. Changes in the size or shape of the heart can be associated with certain conditions, including heart failure, aneurysm, and CAD.  Heart muscle function.  Heart valve function.  Signs of a past heart attack.  Fluid buildup around the heart.  Thickening of the heart muscle.  A tumor or infectious growth around the heart valves. Tell a health care provider about:  Any allergies you have.  All medicines you are taking, including vitamins, herbs, eye drops, creams, and over-the-counter medicines.  Any blood disorders you have.  Any surgeries you have had.  Any medical conditions you have.  Whether you are pregnant or may be pregnant. What are the risks? Generally, this is a safe procedure. However, problems may occur,  including:  Allergic reaction to dye (contrast) that may be used during the procedure. What happens before the procedure? No specific preparation is needed. You may eat and drink normally. What happens during the procedure?   An IV tube may be inserted into one of your veins.  You may receive contrast through this tube. A contrast is an injection that improves the quality of the pictures from your heart.  A gel will be applied to your chest.  A wand-like tool (transducer) will be moved over your chest. The gel will help to transmit the sound waves from the transducer.  The sound waves will harmlessly bounce off of your heart to allow the heart images to be captured in real-time motion. The images will be recorded on a computer. The procedure may vary among health care providers and hospitals. What happens after the procedure?  You may return to your normal, everyday life, including diet, activities, and  medicines, unless your health care provider tells you not to do that. Summary  An echocardiogram is a procedure that uses painless sound waves (ultrasound) to produce an image of the heart.  Images from an echocardiogram can provide important information about the size and shape of your heart, heart muscle function, heart valve function, and fluid buildup around your heart.  You do not need to do anything to prepare before this procedure. You may eat and drink normally.  After the echocardiogram is completed, you may return to your normal, everyday life, unless your health care provider tells you not to do that. This information is not intended to replace advice given to you by your health care provider. Make sure you discuss any questions you have with your health care provider. Document Revised: 11/05/2018 Document Reviewed: 08/17/2016 Elsevier Patient Education  [place].

## 2020-02-02 NOTE — Progress Notes (Signed)
Cardiology Office Note:    Date:  02/02/2020   ID:  [name], DOB 04/04/1946, MRN [id number]  PCP:  Nicoletta Dress, MD  Cardiologist:  Jenean Lindau, MD   Referring MD: Nicoletta Dress, MD    ASSESSMENT:    1. Essential hypertension   2. Mixed dyslipidemia   3. Diabetes mellitus due to underlying condition with unspecified complications ([place])   4. Chest pain, unspecified type   5. Palpitations   6. Cardiac murmur    PLAN:    In order of problems listed above:  1. Primary prevention stressed with the patient.  Importance of compliance with diet and medical stressed and she vocalized understanding. 2. Chest discomfort: It appears atypical for coronary etiology.  However in view of the fact that she has multiple risk factors and is a diabetic I told her to take a coated aspirin daily on a regular basis 81 mg.  For this reason we'll do a Lexiscan sestamibi.  She knows to go to nearest emergency room for any concerning symptoms. 3. Essential hypertension: Blood pressure stable.  Echocardiogram will be done to assess murmur on auscultation 4. Dyslipidemia on statin therapy.  Followed by primary care physician. 5. Diabetes mellitus: Managed by primary care physician and diet was emphasized. 6. Palpitations: She will undergo 2-week monitoring to assess this issue.  We'll also get blood work from primary care physician including TSH.  She knows to go to nearest emergency room for any concerning symptoms. 7. Patient will be seen in follow-up appointment in 2 months or earlier if the patient has any concerns    Medication Adjustments/Labs and Tests Ordered: Current medicines are reviewed at length with the patient today.  Concerns regarding medicines are outlined above.  Orders Placed This Encounter  Procedures  . EKG 12-Lead   No orders of the defined types were placed in this encounter.    History of Present Illness:    Casey Watkins is a 74 y.o. female who is being  seen today for the evaluation of chest discomfort and palpitations at the request of Nicoletta Dress, MD.  Patient is a pleasant 74 year old female.  She has past medical history of essential hypertension dyslipidemia and diabetes mellitus.  She leads a sedentary lifestyle because of orthopedic issues involving her back.  She mentions to me that she occasionally has chest discomfort stabbing-like sensation no radiation to the neck or to the arms.  This does not occur on stress.  At the time of my evaluation, the patient is alert awake oriented and in no distress.  She also feels palpitations and skipped l beat-like sensations regularly.  No syncope.  For this reason patient want to be evaluated.  At the time of my evaluation, the patient is alert awake oriented and in no distress.  Past Medical History:  Diagnosis Date  . APC (atrial premature contractions)   . Cataract    Left  . Chronic GERD   . Chronic insomnia   . Degeneration of lumbar intervertebral disc   . Facet arthritis of lumbar region   . Hyperlipidemia   . Hypertension   . Hypopotassemia   . Leg cramps   . Menopausal state   . Non-alcoholic fatty liver disease   . Osteopenia of multiple sites   . Type 2 diabetes, uncontrolled, with retinopathy ([place])   . Uncontrolled type 2 diabetes mellitus with peripheral neuropathy ([place])   . Vitamin D deficiency  Current Medications: Current Meds  Medication Sig  . alendronate (FOSAMAX) 70 MG tablet Take 1 tablet by mouth once a week.  Marland Kitchen amLODipine (NORVASC) 5 MG tablet Take 1 tablet by mouth daily.  Marland Kitchen augmented betamethasone dipropionate (DIPROLENE-AF) 0.05 % cream Apply 1 application topically as needed.   . Calcium Carbonate-Vit D-Min (RA CALCIUM 600/VIT D/MINERALS) 600-200 MG-UNIT TABS Take 1 tablet by mouth in the morning and at bedtime.  . famotidine (PEPCID) 20 MG tablet Take 20 mg by mouth 2 (two) times daily.  . furosemide (LASIX) 40 MG tablet Take 1 tablet by mouth  daily.  Marland Kitchen gabapentin (NEURONTIN) 300 MG capsule Take 300 mg by mouth 2 (two) times daily.  Marland Kitchen glipiZIDE (GLUCOTROL) 5 MG tablet Take 1 tablet by mouth daily.  . IBU 800 MG tablet Take 1 tablet by mouth as needed.  . insulin detemir (LEVEMIR FLEXTOUCH) 100 UNIT/ML FlexPen Inject 60 Units into the skin at bedtime.  . insulin lispro (HUMALOG KWIKPEN) 200 UNIT/ML KwikPen Inject 15 Units into the skin in the morning, at noon, and at bedtime.  Marland Kitchen lisinopril (ZESTRIL) 40 MG tablet Take 1 tablet by mouth daily.  Marland Kitchen omeprazole (PRILOSEC) 20 MG capsule Take 1 capsule by mouth daily.  . pravastatin (PRAVACHOL) 20 MG tablet Take 1 tablet by mouth 2 (two) times a week.  . zolpidem (AMBIEN) 10 MG tablet Take 1 tablet by mouth at bedtime as needed.     Allergies:   Shellfish allergy   Social History   Socioeconomic History  . Marital status: Married    Spouse name: Not on file  . Number of children: Not on file  . Years of education: Not on file  . Highest education level: Not on file  Occupational History  . Not on file  Tobacco Use  . Smoking status: Never Smoker  . Smokeless tobacco: Never Used  Substance and Sexual Activity  . Alcohol use: Not on file  . Drug use: Not on file  . Sexual activity: Not on file  Other Topics Concern  . Not on file  Social History Narrative  . Not on file   Social Determinants of Health   Financial Resource Strain:   . Difficulty of Paying Living Expenses:   Food Insecurity:   . Worried About Charity fundraiser in the Last Year:   . Arboriculturist in the Last Year:   Transportation Needs:   . Film/video editor (Medical):   Marland Kitchen Lack of Transportation (Non-Medical):   Physical Activity:   . Days of Exercise per Week:   . Minutes of Exercise per Session:   Stress:   . Feeling of Stress :   Social Connections:   . Frequency of Communication with Friends and Family:   . Frequency of Social Gatherings with Friends and Family:   . Attends Religious  Services:   . Active Member of Clubs or Organizations:   . Attends Archivist Meetings:   Marland Kitchen Marital Status:      Family History: The patient's family history includes CAD in her mother; Dementia in her mother; Diabetes in her father; Heart disease in her mother; Hypertension in her brother, father, and sister; Lung cancer in her brother.  ROS:   Please see the history of present illness.    All other systems reviewed and are negative.  EKGs/Labs/Other Studies Reviewed:    The following studies were reviewed today: EKG reveals sinus rhythm and nonspecific ST-T changes  Recent Labs: No results found for requested labs within last 8760 hours.  Recent Lipid Panel No results found for: CHOL, TRIG, HDL, CHOLHDL, VLDL, LDLCALC, LDLDIRECT  Physical Exam:    VS:  BP 120/70   Pulse 70   Ht 5' (1.524 m)   Wt 167 lb (75.8 kg)   SpO2 97%   BMI 32.61 kg/m     Wt Readings from Last 3 Encounters:  02/02/20 167 lb (75.8 kg)     GEN: Patient is in no acute distress HEENT: Normal NECK: No JVD; No carotid bruits LYMPHATICS: No lymphadenopathy CARDIAC: S1 S2 regular, 2/6 systolic murmur at the apex. RESPIRATORY:  Clear to auscultation without rales, wheezing or rhonchi  ABDOMEN: Soft, non-tender, non-distended MUSCULOSKELETAL:  No edema; No deformity  SKIN: Warm and dry NEUROLOGIC:  Alert and oriented x 3 PSYCHIATRIC:  Normal affect    Signed, Jenean Lindau, MD  02/02/2020 3:28 PM    [facility]

## 2020-02-09 ENCOUNTER — Telehealth (HOSPITAL_COMMUNITY): Payer: Self-pay | Admitting: *Deleted

## 2020-02-09 NOTE — Telephone Encounter (Signed)
Attempted to call patient regarding upcoming appt, no answer, unable to leave a message.  Casey Watkins 

## 2020-02-10 DIAGNOSIS — H1012 Acute atopic conjunctivitis, left eye: Secondary | ICD-10-CM | POA: Diagnosis not present

## 2020-02-15 ENCOUNTER — Telehealth: Payer: Self-pay | Admitting: *Deleted

## 2020-02-15 NOTE — Telephone Encounter (Signed)
Patient given detailed instructions per Myocardial Perfusion Study Information Sheet for the test on 02/16/2020 at 0800. Patient notified to arrive 15 minutes early and that it is imperative to arrive on time for appointment to keep from having the test rescheduled.  If you need to cancel or reschedule your appointment, please call the office within 24 hours of your appointment. . Patient verbalized understanding.Emelyn Roen, Ranae Palms  no mychart

## 2020-02-16 ENCOUNTER — Ambulatory Visit (INDEPENDENT_AMBULATORY_CARE_PROVIDER_SITE_OTHER): Payer: Medicare Other

## 2020-02-16 ENCOUNTER — Other Ambulatory Visit: Payer: Self-pay

## 2020-02-16 DIAGNOSIS — R072 Precordial pain: Secondary | ICD-10-CM | POA: Diagnosis not present

## 2020-02-16 DIAGNOSIS — R079 Chest pain, unspecified: Secondary | ICD-10-CM | POA: Diagnosis not present

## 2020-02-16 MED ORDER — TECHNETIUM TC 99M TETROFOSMIN IV KIT
10.6000 | PACK | Freq: Once | INTRAVENOUS | Status: AC | PRN
Start: 2020-02-16 — End: 2020-02-16
  Administered 2020-02-16: 10.6 via INTRAVENOUS

## 2020-02-16 MED ORDER — TECHNETIUM TC 99M TETROFOSMIN IV KIT
30.9000 | PACK | Freq: Once | INTRAVENOUS | Status: AC | PRN
  Administered 2020-02-16: 30.9 via INTRAVENOUS

## 2020-02-16 MED ORDER — REGADENOSON 0.4 MG/5ML IV SOLN
0.4000 mg | Freq: Once | INTRAVENOUS | Status: AC
  Administered 2020-02-16: 0.4 mg via INTRAVENOUS

## 2020-02-17 LAB — MYOCARDIAL PERFUSION IMAGING
LV dias vol: 51 mL (ref 46–106)
LV sys vol: 9 mL
Peak HR: 89 {beats}/min
Rest HR: 68 {beats}/min
SDS: 2
SRS: 1
SSS: 3
TID: 0.94

## 2020-02-21 ENCOUNTER — Other Ambulatory Visit: Payer: Self-pay

## 2020-02-21 ENCOUNTER — Ambulatory Visit (INDEPENDENT_AMBULATORY_CARE_PROVIDER_SITE_OTHER): Payer: Medicare Other

## 2020-02-21 ENCOUNTER — Telehealth: Payer: Self-pay

## 2020-02-21 DIAGNOSIS — R011 Cardiac murmur, unspecified: Secondary | ICD-10-CM

## 2020-02-21 DIAGNOSIS — R002 Palpitations: Secondary | ICD-10-CM

## 2020-02-21 LAB — ECHOCARDIOGRAM COMPLETE
Area-P 1/2: 5.54 cm2
S' Lateral: 2.1 cm

## 2020-02-21 NOTE — Telephone Encounter (Signed)
-----   Message from Jenean Lindau, MD sent at 02/21/2020  8:24 AM EDT ----- The results of the study is unremarkable. Please inform patient. I will discuss in detail at next appointment. Cc  primary care/referring physician Jenean Lindau, MD 02/21/2020 8:24 AM

## 2020-02-21 NOTE — Progress Notes (Signed)
Complete echocardiogram has been performed.  Jimmy Rheya [place]ogue RDCS, RVT 

## 2020-02-21 NOTE — Telephone Encounter (Signed)
Spoke with patient regarding results and recommendation.  Patient verbalizes understanding and is agreeable to plan of care. Advised patient to call back with any issues or concerns.  

## 2020-02-22 ENCOUNTER — Telehealth: Payer: Self-pay

## 2020-02-22 NOTE — Telephone Encounter (Signed)
Tried calling patient. No answer and no voicemail set up for me to leave a message. 

## 2020-02-22 NOTE — Telephone Encounter (Signed)
-----   Message from Jenean Lindau, MD sent at 02/22/2020  8:39 AM EDT ----- The results of the study is unremarkable. Please inform patient. I will discuss in detail at next appointment. Cc  primary care/referring physician Jenean Lindau, MD 02/22/2020 8:39 AM

## 2020-02-24 ENCOUNTER — Telehealth: Payer: Self-pay | Admitting: *Deleted

## 2020-02-24 NOTE — Telephone Encounter (Signed)
Follow Up  Patient returning call. Transferred to [employer]

## 2020-02-24 NOTE — Telephone Encounter (Signed)
Informed pt there not anything on the echo that we need to be concerned about and that Dr. Geraldo Pitter will go over at next [place].

## 2020-02-24 NOTE — Telephone Encounter (Signed)
Left message for pt to call us back.Need to ask about the heart monitor she wore. [employer] sent a notification that the monitor she sent back did not have any data on it. Did it fall off? How long was she able to wear it?

## 2020-02-24 NOTE — Telephone Encounter (Signed)
Pt had no problem wearing the monitor. She stated wore it the number of days prescribed and then mailed it back. Monitor came back with no data on it. Please advise

## 2020-02-25 NOTE — Telephone Encounter (Signed)
Please repeated and let the company know.

## 2020-02-28 ENCOUNTER — Other Ambulatory Visit: Payer: Self-pay | Admitting: Cardiology

## 2020-02-28 DIAGNOSIS — E1165 Type 2 diabetes mellitus with hyperglycemia: Secondary | ICD-10-CM | POA: Diagnosis not present

## 2020-02-28 DIAGNOSIS — R002 Palpitations: Secondary | ICD-10-CM

## 2020-02-28 DIAGNOSIS — E1142 Type 2 diabetes mellitus with diabetic polyneuropathy: Secondary | ICD-10-CM | POA: Diagnosis not present

## 2020-02-28 NOTE — Telephone Encounter (Signed)
Spoke with pt and she would like to come and have monitor put on in office. She will come on Wed 8/4 between 9 and 9:30 to have a 14 day zio put on.

## 2020-03-01 ENCOUNTER — Other Ambulatory Visit: Payer: Self-pay

## 2020-04-04 ENCOUNTER — Other Ambulatory Visit: Payer: Self-pay

## 2020-04-04 ENCOUNTER — Encounter: Payer: Self-pay | Admitting: Cardiology

## 2020-04-04 ENCOUNTER — Ambulatory Visit: Payer: Medicare Other | Admitting: Cardiology

## 2020-04-04 VITALS — BP 124/50 | HR 71 | Ht 60.0 in | Wt 169.2 lb

## 2020-04-04 DIAGNOSIS — I10 Essential (primary) hypertension: Secondary | ICD-10-CM | POA: Diagnosis not present

## 2020-04-04 DIAGNOSIS — E088 Diabetes mellitus due to underlying condition with unspecified complications: Secondary | ICD-10-CM

## 2020-04-04 DIAGNOSIS — R002 Palpitations: Secondary | ICD-10-CM | POA: Diagnosis not present

## 2020-04-04 DIAGNOSIS — E782 Mixed hyperlipidemia: Secondary | ICD-10-CM | POA: Diagnosis not present

## 2020-04-04 NOTE — Progress Notes (Signed)
Cardiology Office Note:    Date:  04/04/2020   ID:  Casey Watkins, DOB 1945-11-12, MRN [id number]  PCP:  Nicoletta Dress, MD  Cardiologist:  Jenean Lindau, MD   Referring MD: Nicoletta Dress, MD    ASSESSMENT:    1. Essential hypertension   2. Palpitations   3. Mixed dyslipidemia   4. Diabetes mellitus due to underlying condition with unspecified complications ([place])    PLAN:    In order of problems listed above:  1. Primary prevention stressed with the patient.  Importance of compliance with diet medication stressed and she vocalized understanding. 2. Essential hypertension: Blood pressure is stable 3. Mixed dyslipidemia: Lipids followed by primary care physician.  I reviewed them from [facility] notes and told that they need to be a little better. Diabetes mellitus: Managed by primary care physician and weight reduction was stressed.  Hemoglobin A1c is markedly elevated. Results of stress test and echocardiogram discussed with the patient and let and questions were answered to her satisfaction. Palpitations: Patient did not get a ZIO monitor.  Her palpitations have resolved and she does not want to get it done at this time.  I respect her wishes. Patient will be seen in follow-up appointment in 6 months or earlier if the patient has any concerns   Medication Adjustments/Labs and Tests Ordered: Current medicines are reviewed at length with the patient today.  Concerns regarding medicines are outlined above.  No orders of the defined types were placed in this encounter.  No orders of the defined types were placed in this encounter.    No chief complaint on file.    History of Present Illness:    Casey Watkins is a 74 y.o. female.  Patient was evaluated by me for palpitations hypertension and dyslipidemia.  She is a diabetic.  She is overweight.  She leads sedentary lifestyle because of orthopedic issues.  No chest pain orthopnea or PND.  At the time of my evaluation, the  patient is alert awake oriented and in no distress.  Past Medical History:  Diagnosis Date  . APC (atrial premature contractions)   . Cataract    Left  . Chronic GERD   . Chronic insomnia   . Degeneration of lumbar intervertebral disc   . Facet arthritis of lumbar region   . Hyperlipidemia   . Hypertension   . Hypopotassemia   . Leg cramps   . Menopausal state   . Non-alcoholic fatty liver disease   . Osteopenia of multiple sites   . Type 2 diabetes, uncontrolled, with retinopathy ([place])   . Uncontrolled type 2 diabetes mellitus with peripheral neuropathy ([place])   . Vitamin D deficiency     Past Surgical History:  Procedure Laterality Date  . TOTAL KNEE ARTHROPLASTY      Current Medications: Current Meds  Medication Sig  . alendronate (FOSAMAX) 70 MG tablet Take 1 tablet by mouth once a week.  Marland Kitchen amLODipine (NORVASC) 5 MG tablet Take 1 tablet by mouth daily.  Marland Kitchen augmented betamethasone dipropionate (DIPROLENE-AF) 0.05 % cream Apply 1 application topically as needed.   . Calcium Carbonate-Vit D-Min (RA CALCIUM 600/VIT D/MINERALS) 600-200 MG-UNIT TABS Take 1 tablet by mouth in the morning and at bedtime.  . famotidine (PEPCID) 20 MG tablet Take 20 mg by mouth 2 (two) times daily.  . furosemide (LASIX) 40 MG tablet Take 1 tablet by mouth daily.  Marland Kitchen glipiZIDE (GLUCOTROL) 5 MG tablet Take 1 tablet by mouth daily.  Marland Kitchen  IBU 800 MG tablet Take 1 tablet by mouth as needed.  . insulin detemir (LEVEMIR FLEXTOUCH) 100 UNIT/ML FlexPen Inject 60 Units into the skin at bedtime.  . insulin lispro (HUMALOG KWIKPEN) 200 UNIT/ML KwikPen Inject 15 Units into the skin in the morning, at noon, and at bedtime.  Marland Kitchen lisinopril (ZESTRIL) 40 MG tablet Take 1 tablet by mouth daily.  Marland Kitchen omeprazole (PRILOSEC) 20 MG capsule Take 1 capsule by mouth daily.  . pravastatin (PRAVACHOL) 20 MG tablet Take 1 tablet by mouth 2 (two) times a week.  Marland Kitchen PRED FORTE 1 % ophthalmic suspension Place 1 drop into the left eye 2  (two) times daily.  Marland Kitchen zolpidem (AMBIEN) 10 MG tablet Take 1 tablet by mouth at bedtime as needed.     Allergies:   Shellfish allergy   Social History   Socioeconomic History  . Marital status: Married    Spouse name: Not on file  . Number of children: Not on file  . Years of education: Not on file  . Highest education level: Not on file  Occupational History  . Not on file  Tobacco Use  . Smoking status: Never Smoker  . Smokeless tobacco: Never Used  Substance and Sexual Activity  . Alcohol use: Not on file  . Drug use: Not on file  . Sexual activity: Not on file  Other Topics Concern  . Not on file  Social History Narrative  . Not on file   Social Determinants of Health   Financial Resource Strain:   . Difficulty of Paying Living Expenses: Not on file  Food Insecurity:   . Worried About Charity fundraiser in the Last Year: Not on file  . Ran Out of Food in the Last Year: Not on file  Transportation Needs:   . Lack of Transportation (Medical): Not on file  . Lack of Transportation (Non-Medical): Not on file  Physical Activity:   . Days of Exercise per Week: Not on file  . Minutes of Exercise per Session: Not on file  Stress:   . Feeling of Stress : Not on file  Social Connections:   . Frequency of Communication with Friends and Family: Not on file  . Frequency of Social Gatherings with Friends and Family: Not on file  . Attends Religious Services: Not on file  . Active Member of Clubs or Organizations: Not on file  . Attends Archivist Meetings: Not on file  . Marital Status: Not on file     Family History: The patient's family history includes CAD in her mother; Dementia in her mother; Diabetes in her father; Heart disease in her mother; Hypertension in her brother, father, and sister; Lung cancer in her brother.  ROS:   Please see the history of present illness.    All other systems reviewed and are negative.  EKGs/Labs/Other Studies Reviewed:      The following studies were reviewed today: IMPRESSIONS    1. Left ventricular ejection fraction, by estimation, is 60 to 65%. The  left ventricle has normal function. The left ventricle has no regional  wall motion abnormalities. Left ventricular diastolic parameters were  normal.  2. Right ventricular systolic function is normal. The right ventricular  size is normal. There is normal pulmonary artery systolic pressure.  3. The mitral valve is normal in structure. No evidence of mitral valve  regurgitation. No evidence of mitral stenosis.  4. The aortic valve is tricuspid. Aortic valve regurgitation is not  visualized.  Mild aortic valve sclerosis is present, with no evidence of  aortic valve stenosis.  5. The inferior vena cava is normal in size with greater than 50%  respiratory variability, suggesting right atrial pressure of 3 mmHg.   Study Highlights   The left ventricular ejection fraction is hyperdynamic (>65%).  Nuclear stress EF: 82%.  There was no ST segment deviation noted during stress.  This is a low risk study.  No evidence of ischemia or MI.  Normal EF.       Recent Labs: No results found for requested labs within last 8760 hours.  Recent Lipid Panel No results found for: CHOL, TRIG, HDL, CHOLHDL, VLDL, LDLCALC, LDLDIRECT  Physical Exam:    VS:  BP (!) 124/50   Pulse 71   Ht 5' (1.524 m)   Wt 169 lb 3.2 oz (76.7 kg)   SpO2 94%   BMI 33.04 kg/m     Wt Readings from Last 3 Encounters:  04/04/20 169 lb 3.2 oz (76.7 kg)  02/16/20 167 lb (75.8 kg)  02/02/20 167 lb (75.8 kg)     GEN: Patient is in no acute distress HEENT: Normal NECK: No JVD; No carotid bruits LYMPHATICS: No lymphadenopathy CARDIAC: Hear sounds regular, 2/6 systolic murmur at the apex. RESPIRATORY:  Clear to auscultation without rales, wheezing or rhonchi  ABDOMEN: Soft, non-tender, non-distended MUSCULOSKELETAL:  No edema; No deformity  SKIN: Warm and dry NEUROLOGIC:   Alert and oriented x 3 PSYCHIATRIC:  Normal affect   Signed, Jenean Lindau, MD  04/04/2020 1:35 PM    [facility]

## 2020-04-04 NOTE — Patient Instructions (Signed)
Medication Instructions:  Your physician recommends that you continue on your current medications as directed. Please refer to the Current Medication list given to you today.  *If you need a refill on your cardiac medications before your next appointment, please call your pharmacy*   Lab Work: None ordered   If you have labs (blood work) drawn today and your tests are completely normal, you will receive your results only by: . MyChart Message (if you have MyChart) OR . A paper copy in the mail If you have any lab test that is abnormal or we need to change your treatment, we will call you to review the results.   Testing/Procedures: None ordered    Follow-Up: At [facility], you and your health needs are our priority.  As part of our continuing mission to provide you with exceptional heart care, we have created designated Provider Care Teams.  These Care Teams include your primary Cardiologist (physician) and Advanced Practice Providers (APPs -  Physician Assistants and Nurse Practitioners) who all work together to provide you with the care you need, when you need it.  We recommend signing up for the patient portal called "MyChart".  Sign up information is provided on this After Visit Summary.  MyChart is used to connect with patients for Virtual Visits (Telemedicine).  Patients are able to view lab/test results, encounter notes, upcoming appointments, etc.  Non-urgent messages can be sent to your provider as well.   To learn more about what you can do with MyChart, go to [email or web address].    Your next appointment:   6 month(s)  The format for your next appointment:   In Person  Provider:   Rajan Revankar, MD   Other Instructions None   

## 2020-04-11 DIAGNOSIS — M205X1 Other deformities of toe(s) (acquired), right foot: Secondary | ICD-10-CM | POA: Diagnosis not present

## 2020-04-11 DIAGNOSIS — M2041 Other hammer toe(s) (acquired), right foot: Secondary | ICD-10-CM

## 2020-04-11 DIAGNOSIS — M205X9 Other deformities of toe(s) (acquired), unspecified foot: Secondary | ICD-10-CM | POA: Insufficient documentation

## 2020-04-11 DIAGNOSIS — B351 Tinea unguium: Secondary | ICD-10-CM

## 2020-04-11 DIAGNOSIS — M2042 Other hammer toe(s) (acquired), left foot: Secondary | ICD-10-CM | POA: Diagnosis not present

## 2020-04-11 DIAGNOSIS — M205X2 Other deformities of toe(s) (acquired), left foot: Secondary | ICD-10-CM | POA: Diagnosis not present

## 2020-04-11 HISTORY — DX: Tinea unguium: B35.1

## 2020-04-11 HISTORY — DX: Other hammer toe(s) (acquired), right foot: M20.41

## 2020-04-11 HISTORY — DX: Other deformities of toe(s) (acquired), unspecified foot: M20.5X9

## 2020-04-17 DIAGNOSIS — E1142 Type 2 diabetes mellitus with diabetic polyneuropathy: Secondary | ICD-10-CM | POA: Diagnosis not present

## 2020-04-17 DIAGNOSIS — E785 Hyperlipidemia, unspecified: Secondary | ICD-10-CM | POA: Diagnosis not present

## 2020-04-17 DIAGNOSIS — E559 Vitamin D deficiency, unspecified: Secondary | ICD-10-CM | POA: Diagnosis not present

## 2020-04-19 DIAGNOSIS — E1165 Type 2 diabetes mellitus with hyperglycemia: Secondary | ICD-10-CM | POA: Diagnosis not present

## 2020-04-19 DIAGNOSIS — I10 Essential (primary) hypertension: Secondary | ICD-10-CM | POA: Diagnosis not present

## 2020-04-19 DIAGNOSIS — E559 Vitamin D deficiency, unspecified: Secondary | ICD-10-CM | POA: Diagnosis not present

## 2020-04-19 DIAGNOSIS — E1142 Type 2 diabetes mellitus with diabetic polyneuropathy: Secondary | ICD-10-CM | POA: Diagnosis not present

## 2020-04-19 DIAGNOSIS — E785 Hyperlipidemia, unspecified: Secondary | ICD-10-CM | POA: Diagnosis not present

## 2020-05-08 DIAGNOSIS — M545 Low back pain, unspecified: Secondary | ICD-10-CM | POA: Diagnosis not present

## 2020-05-08 DIAGNOSIS — M25552 Pain in left hip: Secondary | ICD-10-CM | POA: Diagnosis not present

## 2020-05-25 DIAGNOSIS — M5136 Other intervertebral disc degeneration, lumbar region: Secondary | ICD-10-CM | POA: Diagnosis not present

## 2020-05-25 DIAGNOSIS — M9903 Segmental and somatic dysfunction of lumbar region: Secondary | ICD-10-CM | POA: Diagnosis not present

## 2020-05-26 DIAGNOSIS — M9903 Segmental and somatic dysfunction of lumbar region: Secondary | ICD-10-CM | POA: Diagnosis not present

## 2020-05-26 DIAGNOSIS — M5136 Other intervertebral disc degeneration, lumbar region: Secondary | ICD-10-CM | POA: Diagnosis not present

## 2020-05-29 DIAGNOSIS — M9903 Segmental and somatic dysfunction of lumbar region: Secondary | ICD-10-CM | POA: Diagnosis not present

## 2020-05-29 DIAGNOSIS — M5136 Other intervertebral disc degeneration, lumbar region: Secondary | ICD-10-CM | POA: Diagnosis not present

## 2020-05-31 DIAGNOSIS — M5136 Other intervertebral disc degeneration, lumbar region: Secondary | ICD-10-CM | POA: Diagnosis not present

## 2020-05-31 DIAGNOSIS — M9903 Segmental and somatic dysfunction of lumbar region: Secondary | ICD-10-CM | POA: Diagnosis not present

## 2020-06-02 DIAGNOSIS — Z23 Encounter for immunization: Secondary | ICD-10-CM | POA: Diagnosis not present

## 2020-06-02 DIAGNOSIS — M9903 Segmental and somatic dysfunction of lumbar region: Secondary | ICD-10-CM | POA: Diagnosis not present

## 2020-06-02 DIAGNOSIS — M5136 Other intervertebral disc degeneration, lumbar region: Secondary | ICD-10-CM | POA: Diagnosis not present

## 2020-06-05 DIAGNOSIS — M9903 Segmental and somatic dysfunction of lumbar region: Secondary | ICD-10-CM | POA: Diagnosis not present

## 2020-06-05 DIAGNOSIS — M5136 Other intervertebral disc degeneration, lumbar region: Secondary | ICD-10-CM | POA: Diagnosis not present

## 2020-06-07 DIAGNOSIS — M9903 Segmental and somatic dysfunction of lumbar region: Secondary | ICD-10-CM | POA: Diagnosis not present

## 2020-06-07 DIAGNOSIS — M25552 Pain in left hip: Secondary | ICD-10-CM | POA: Diagnosis not present

## 2020-06-07 DIAGNOSIS — M5136 Other intervertebral disc degeneration, lumbar region: Secondary | ICD-10-CM | POA: Diagnosis not present

## 2020-06-09 DIAGNOSIS — M5136 Other intervertebral disc degeneration, lumbar region: Secondary | ICD-10-CM | POA: Diagnosis not present

## 2020-06-09 DIAGNOSIS — M9903 Segmental and somatic dysfunction of lumbar region: Secondary | ICD-10-CM | POA: Diagnosis not present

## 2020-06-12 DIAGNOSIS — M5136 Other intervertebral disc degeneration, lumbar region: Secondary | ICD-10-CM | POA: Diagnosis not present

## 2020-06-12 DIAGNOSIS — M9903 Segmental and somatic dysfunction of lumbar region: Secondary | ICD-10-CM | POA: Diagnosis not present

## 2020-06-14 DIAGNOSIS — M9903 Segmental and somatic dysfunction of lumbar region: Secondary | ICD-10-CM | POA: Diagnosis not present

## 2020-06-14 DIAGNOSIS — M5136 Other intervertebral disc degeneration, lumbar region: Secondary | ICD-10-CM | POA: Diagnosis not present

## 2020-06-15 DIAGNOSIS — Z9181 History of falling: Secondary | ICD-10-CM | POA: Diagnosis not present

## 2020-06-15 DIAGNOSIS — Z0000 Encounter for general adult medical examination without abnormal findings: Secondary | ICD-10-CM | POA: Diagnosis not present

## 2020-06-15 DIAGNOSIS — E785 Hyperlipidemia, unspecified: Secondary | ICD-10-CM | POA: Diagnosis not present

## 2020-06-16 DIAGNOSIS — M5136 Other intervertebral disc degeneration, lumbar region: Secondary | ICD-10-CM | POA: Diagnosis not present

## 2020-06-16 DIAGNOSIS — M9903 Segmental and somatic dysfunction of lumbar region: Secondary | ICD-10-CM | POA: Diagnosis not present

## 2020-06-19 DIAGNOSIS — M5136 Other intervertebral disc degeneration, lumbar region: Secondary | ICD-10-CM | POA: Diagnosis not present

## 2020-06-19 DIAGNOSIS — M9903 Segmental and somatic dysfunction of lumbar region: Secondary | ICD-10-CM | POA: Diagnosis not present

## 2020-06-20 DIAGNOSIS — M9903 Segmental and somatic dysfunction of lumbar region: Secondary | ICD-10-CM | POA: Diagnosis not present

## 2020-06-20 DIAGNOSIS — M5136 Other intervertebral disc degeneration, lumbar region: Secondary | ICD-10-CM | POA: Diagnosis not present

## 2020-06-21 DIAGNOSIS — M5136 Other intervertebral disc degeneration, lumbar region: Secondary | ICD-10-CM | POA: Diagnosis not present

## 2020-06-21 DIAGNOSIS — M9903 Segmental and somatic dysfunction of lumbar region: Secondary | ICD-10-CM | POA: Diagnosis not present

## 2020-06-26 DIAGNOSIS — M5136 Other intervertebral disc degeneration, lumbar region: Secondary | ICD-10-CM | POA: Diagnosis not present

## 2020-06-26 DIAGNOSIS — M9903 Segmental and somatic dysfunction of lumbar region: Secondary | ICD-10-CM | POA: Diagnosis not present

## 2020-06-28 DIAGNOSIS — M5136 Other intervertebral disc degeneration, lumbar region: Secondary | ICD-10-CM | POA: Diagnosis not present

## 2020-06-28 DIAGNOSIS — M419 Scoliosis, unspecified: Secondary | ICD-10-CM | POA: Diagnosis not present

## 2020-07-04 DIAGNOSIS — M5459 Other low back pain: Secondary | ICD-10-CM | POA: Diagnosis not present

## 2020-07-18 DIAGNOSIS — M5416 Radiculopathy, lumbar region: Secondary | ICD-10-CM | POA: Diagnosis not present

## 2020-07-26 DIAGNOSIS — M5136 Other intervertebral disc degeneration, lumbar region: Secondary | ICD-10-CM | POA: Diagnosis not present

## 2020-07-26 DIAGNOSIS — M545 Low back pain, unspecified: Secondary | ICD-10-CM | POA: Diagnosis not present

## 2020-07-26 DIAGNOSIS — M25551 Pain in right hip: Secondary | ICD-10-CM | POA: Diagnosis not present

## 2020-07-26 DIAGNOSIS — M419 Scoliosis, unspecified: Secondary | ICD-10-CM | POA: Diagnosis not present

## 2020-08-17 DIAGNOSIS — M47816 Spondylosis without myelopathy or radiculopathy, lumbar region: Secondary | ICD-10-CM | POA: Diagnosis not present

## 2020-08-31 DIAGNOSIS — E109 Type 1 diabetes mellitus without complications: Secondary | ICD-10-CM | POA: Diagnosis not present

## 2020-08-31 DIAGNOSIS — M419 Scoliosis, unspecified: Secondary | ICD-10-CM | POA: Diagnosis not present

## 2020-08-31 DIAGNOSIS — M5459 Other low back pain: Secondary | ICD-10-CM | POA: Diagnosis not present

## 2020-09-06 ENCOUNTER — Ambulatory Visit: Payer: Self-pay | Admitting: Orthopedic Surgery

## 2020-09-11 DIAGNOSIS — E785 Hyperlipidemia, unspecified: Secondary | ICD-10-CM | POA: Diagnosis not present

## 2020-09-11 DIAGNOSIS — R600 Localized edema: Secondary | ICD-10-CM | POA: Diagnosis not present

## 2020-09-11 DIAGNOSIS — Z1231 Encounter for screening mammogram for malignant neoplasm of breast: Secondary | ICD-10-CM | POA: Diagnosis not present

## 2020-09-11 DIAGNOSIS — E1165 Type 2 diabetes mellitus with hyperglycemia: Secondary | ICD-10-CM | POA: Diagnosis not present

## 2020-09-11 DIAGNOSIS — E1142 Type 2 diabetes mellitus with diabetic polyneuropathy: Secondary | ICD-10-CM | POA: Diagnosis not present

## 2020-09-11 DIAGNOSIS — E559 Vitamin D deficiency, unspecified: Secondary | ICD-10-CM | POA: Diagnosis not present

## 2020-09-11 DIAGNOSIS — I10 Essential (primary) hypertension: Secondary | ICD-10-CM | POA: Diagnosis not present

## 2020-10-02 ENCOUNTER — Ambulatory Visit: Payer: Self-pay | Admitting: Orthopedic Surgery

## 2020-10-02 ENCOUNTER — Ambulatory Visit: Payer: Medicare Other | Admitting: Cardiology

## 2020-10-02 NOTE — H&P (Signed)
Casey Watkins is an 75 y.o. female.   Chief Complaint: back and leg pain HPI: Reason for Visit: (normal) visit for: (back) Location (Lower Extremity): lower back pain ; thigh pain on the left, , ; leg pain on the left, , ; foot pain on the left, , Severity: pain level 10/10 Quality: sharp; aching; burning; stabbing Aggravating Factors: standing for ; going from sit to stand Associated Symptoms: numbness/tingling; weakness (LLE) Medications: Ibuprofen and Tylenol Notes: the patient is 2 weeks out from L5-S1 facet injection. No relief from the injection. No help from her injection  Past Medical History:  Diagnosis Date   APC (atrial premature contractions)    Cataract    Left   Chronic GERD    Chronic insomnia    Degeneration of lumbar intervertebral disc    Facet arthritis of lumbar region    Hyperlipidemia    Hypertension    Hypopotassemia    Leg cramps    Menopausal state    Non-alcoholic fatty liver disease    Osteopenia of multiple sites    Type 2 diabetes, uncontrolled, with retinopathy ([place])    Uncontrolled type 2 diabetes mellitus with peripheral neuropathy (HCC)    Vitamin D deficiency     Past Surgical History:  Procedure Laterality Date   TOTAL KNEE ARTHROPLASTY      Family History  Problem Relation Age of Onset   CAD Mother    Dementia Mother    Heart disease Mother    Diabetes Father    Hypertension Father    Hypertension Sister    Hypertension Brother    Lung cancer Brother    Social History:  reports that she has never smoked. She has never used smokeless tobacco. No history on file for alcohol use and drug use.  Allergies:  Allergies  Allergen Reactions   Shellfish Allergy Shortness Of Breath    shrimp   Current medications alendronate 70 mg tablet amLODIPine 5 mg tablet furosemide glipiZIDE 5 mg tablet HumaLOG KwikPen U-200 Insulin 200 unit/mL (3 mL) subcutaneous IBU 800 mg tablet Levemir FlexTouch U-100 Insulin  100 unit/mL (3 mL) subcutaneous pen lisinopriL 40 mg tablet magnesium Neurontin 100 mg capsule omeprazole 40 mg capsule,delayed release OneTouch Verio test strips pravastatin 20 mg tablet Vitamin C Vitamin D vitamin E zolpidem 10 mg tablet  Review of Systems  Constitutional: Negative.   HENT: Negative.   Eyes: Negative.   Respiratory: Negative.   Cardiovascular: Negative.   Gastrointestinal: Negative.   Endocrine: Negative.   Genitourinary: Negative.   Neurological: Positive for weakness and numbness.    There were no vitals taken for this visit. Physical Exam HENT:     Head: Normocephalic.     Right Ear: External ear normal.     Left Ear: External ear normal.     Nose: Nose normal.     Mouth/Throat:     Pharynx: Oropharynx is clear.  Eyes:     Conjunctiva/sclera: Conjunctivae normal.  Cardiovascular:     Rate and Rhythm: Normal rate and regular rhythm.     Pulses: Normal pulses.  Pulmonary:     Effort: Pulmonary effort is normal.  Abdominal:     General: Bowel sounds are normal.  Musculoskeletal:     Cervical back: Normal range of motion.     Comments: Patient is a 75 year old female.  Gait and Station: Appearance: ambulating with no assistive devices and antalgic gait.  Constitutional: General Appearance: healthy-appearing and distress (mild).  Psychiatric: Mood and Affect:  active and alert.  Cardiovascular System: Edema Right: none; Dorsalis and posterior tibial pulses 2+. Edema Left: none.  Abdomen: Inspection and Palpation: non-distended and no tenderness.  Skin: Inspection and palpation: no rash.  Lumbar Spine: Inspection: normal alignment. Bony Palpation of the Lumbar Spine: tender at lumbosacral junction.. Bony Palpation of the Right Hip: no tenderness of the greater trochanter and tenderness of the SI joint; Pelvis stable. Bony Palpation of the Left Hip: no tenderness of the greater trochanter and tenderness of the SI joint. Soft Tissue Palpation on  the Right: No flank pain with percussion. Active Range of Motion: limited flexion and extention.  Motor Strength: L1 Motor Strength on the Right: hip flexion iliopsoas 5/5. L1 Motor Strength on the Left: hip flexion iliopsoas 5/5. L2-L4 Motor Strength on the Right: knee extension quadriceps 5/5. L2-L4 Motor Strength on the Left: knee extension quadriceps 5/5. L5 Motor Strength on the Right: ankle dorsiflexion tibialis anterior 5/5 and great toe extension extensor hallucis longus 5/5. L5 Motor Strength on the Left: ankle dorsiflexion tibialis anterior 5/5 and great toe extension extensor hallucis longus 5/5. S1 Motor Strength on the Right: plantar flexion gastrocnemius 5/5. S1 Motor Strength on the Left: plantar flexion gastrocnemius 5/5.  Neurological System: Knee Reflex Right: normal (2). Knee Reflex Left: normal (2). Ankle Reflex Right: normal (2). Ankle Reflex Left: normal (2). Babinski Reflex Right: plantar reflex absent. Babinski Reflex Left: plantar reflex absent. Sensation on the Right: normal distal extremities. Sensation on the Left: normal distal extremities. Special Tests on the Right: no clonus of the ankle/knee. Special Tests on the Left: no clonus of the ankle/knee and seated straight leg raising test positive.  Neurological:     Mental Status: She is alert.    MRI demonstrates large synovial cyst emanating from the left L5-S1 facet displacing the L5 and S1 nerve roots.  Assessment/Plan Impression:  Refractory L5-S1 radiculopathy secondary to synovial cyst large emanating from the left L5-S1 facet no help from an aspiration injection of corticosteroid  Plan:  Given the presence of radicular pain failing conservative treatment with neurotension signs and plantar flexion weakness we discussed living with her symptoms which she does not want to we also discussed microlumbar decompression L5-S1 left with excision of synovial cyst she would like to proceed with that  I had an extensive  discussion with the patient concerning the pathology relevant anatomy and treatment options. At this point exhausting conservative treatment and in the presence of a neurologic deficit we discussed microlumbar decompression. I discussed the risks and benefits including bleeding, infection, DVT, PE, anesthetic complications, worsening in their symptoms, improvement in their symptoms, C SF leakage, epidural fibrosis, need for future surgeries such as revision discectomy and lumbar fusion. I also indicated that this is an operation to basically decompress the nerve root to allow recovery as opposed to fixing a herniated disc and that the incidence of recurrent chest disc herniation can approach 15%. Also that nerve root recovery is variable and may not recover completely.  I discussed the operative course including overnight in the hospital. Immediate ambulation. Follow-up in 2 weeks for suture removal. 6 weeks until healing of the herniation followed by 6 weeks of reconditioning and strengthening of the core musculature. Also discussed the need to employ the concepts of disc pressure management and core motion following the surgery to minimize the risk of recurrent disc herniation. We will obtain preoperative clearance i if necessary and proceed accordingly. We discussed the possibility of a week current synovial cyst  requiring excision or fusion.  Known history of MRSA or DVT. Insulin-dependent diabetes. Preoperative clearance  Plan microlumbar decompression L5-S1 left and excision of synovial cyst  Cecilie Kicks, PA-C for Dr. Tonita Cong 10/02/2020, 10:45 AM

## 2020-10-02 NOTE — H&P (View-Only) (Signed)
Casey Watkins is an 75 y.o. female.   Chief Complaint: back and leg pain HPI: Reason for Visit: (normal) visit for: (back) Location (Lower Extremity): lower back pain ; thigh pain on the left, , ; leg pain on the left, , ; foot pain on the left, , Severity: pain level 10/10 Quality: sharp; aching; burning; stabbing Aggravating Factors: standing for ; going from sit to stand Associated Symptoms: numbness/tingling; weakness (LLE) Medications: Ibuprofen and Tylenol Notes: the patient is 2 weeks out from L5-S1 facet injection. No relief from the injection. No help from her injection  Past Medical History:  Diagnosis Date  . APC (atrial premature contractions)   . Cataract    Left  . Chronic GERD   . Chronic insomnia   . Degeneration of lumbar intervertebral disc   . Facet arthritis of lumbar region   . Hyperlipidemia   . Hypertension   . Hypopotassemia   . Leg cramps   . Menopausal state   . Non-alcoholic fatty liver disease   . Osteopenia of multiple sites   . Type 2 diabetes, uncontrolled, with retinopathy ([place])   . Uncontrolled type 2 diabetes mellitus with peripheral neuropathy ([place])   . Vitamin D deficiency     Past Surgical History:  Procedure Laterality Date  . TOTAL KNEE ARTHROPLASTY      Family History  Problem Relation Age of Onset  . CAD Mother   . Dementia Mother   . Heart disease Mother   . Diabetes Father   . Hypertension Father   . Hypertension Sister   . Hypertension Brother   . Lung cancer Brother    Social History:  reports that she has never smoked. She has never used smokeless tobacco. No history on file for alcohol use and drug use.  Allergies:  Allergies  Allergen Reactions  . Shellfish Allergy Shortness Of Breath    shrimp   Current medications alendronate 70 mg tablet amLODIPine 5 mg tablet furosemide glipiZIDE 5 mg tablet HumaLOG KwikPen U-200 Insulin 200 unit/mL (3 mL) subcutaneous IBU 800 mg tablet Levemir FlexTouch U-100 Insulin  100 unit/mL (3 mL) subcutaneous pen lisinopriL 40 mg tablet magnesium Neurontin 100 mg capsule omeprazole 40 mg capsule,delayed release OneTouch Verio test strips pravastatin 20 mg tablet Vitamin C Vitamin D vitamin E zolpidem 10 mg tablet  Review of Systems  Constitutional: Negative.   HENT: Negative.   Eyes: Negative.   Respiratory: Negative.   Cardiovascular: Negative.   Gastrointestinal: Negative.   Endocrine: Negative.   Genitourinary: Negative.   Neurological: Positive for weakness and numbness.    There were no vitals taken for this visit. Physical Exam HENT:     Head: Normocephalic.     Right Ear: External ear normal.     Left Ear: External ear normal.     Nose: Nose normal.     Mouth/Throat:     Pharynx: Oropharynx is clear.  Eyes:     Conjunctiva/sclera: Conjunctivae normal.  Cardiovascular:     Rate and Rhythm: Normal rate and regular rhythm.     Pulses: Normal pulses.  Pulmonary:     Effort: Pulmonary effort is normal.  Abdominal:     General: Bowel sounds are normal.  Musculoskeletal:     Cervical back: Normal range of motion.     Comments: Patient is a 75 year old female.  Gait and Station: Appearance: ambulating with no assistive devices and antalgic gait.  Constitutional: General Appearance: healthy-appearing and distress (mild).  Psychiatric: Mood and Affect:  active and alert.  Cardiovascular System: Edema Right: none; Dorsalis and posterior tibial pulses 2+. Edema Left: none.  Abdomen: Inspection and Palpation: non-distended and no tenderness.  Skin: Inspection and palpation: no rash.  Lumbar Spine: Inspection: normal alignment. Bony Palpation of the Lumbar Spine: tender at lumbosacral junction.. Bony Palpation of the Right Hip: no tenderness of the greater trochanter and tenderness of the SI joint; Pelvis stable. Bony Palpation of the Left Hip: no tenderness of the greater trochanter and tenderness of the SI joint. Soft Tissue Palpation on  the Right: No flank pain with percussion. Active Range of Motion: limited flexion and extention.  Motor Strength: L1 Motor Strength on the Right: hip flexion iliopsoas 5/5. L1 Motor Strength on the Left: hip flexion iliopsoas 5/5. L2-L4 Motor Strength on the Right: knee extension quadriceps 5/5. L2-L4 Motor Strength on the Left: knee extension quadriceps 5/5. L5 Motor Strength on the Right: ankle dorsiflexion tibialis anterior 5/5 and great toe extension extensor hallucis longus 5/5. L5 Motor Strength on the Left: ankle dorsiflexion tibialis anterior 5/5 and great toe extension extensor hallucis longus 5/5. S1 Motor Strength on the Right: plantar flexion gastrocnemius 5/5. S1 Motor Strength on the Left: plantar flexion gastrocnemius 5/5.  Neurological System: Knee Reflex Right: normal (2). Knee Reflex Left: normal (2). Ankle Reflex Right: normal (2). Ankle Reflex Left: normal (2). Babinski Reflex Right: plantar reflex absent. Babinski Reflex Left: plantar reflex absent. Sensation on the Right: normal distal extremities. Sensation on the Left: normal distal extremities. Special Tests on the Right: no clonus of the ankle/knee. Special Tests on the Left: no clonus of the ankle/knee and seated straight leg raising test positive.  Neurological:     Mental Status: She is alert.    MRI demonstrates large synovial cyst emanating from the left L5-S1 facet displacing the L5 and S1 nerve roots.  Assessment/Plan Impression:  Refractory L5-S1 radiculopathy secondary to synovial cyst large emanating from the left L5-S1 facet no help from an aspiration injection of corticosteroid  Plan:  Given the presence of radicular pain failing conservative treatment with neurotension signs and plantar flexion weakness we discussed living with her symptoms which she does not want to we also discussed microlumbar decompression L5-S1 left with excision of synovial cyst she would like to proceed with that  I had an extensive  discussion with the patient concerning the pathology relevant anatomy and treatment options. At this point exhausting conservative treatment and in the presence of a neurologic deficit we discussed microlumbar decompression. I discussed the risks and benefits including bleeding, infection, DVT, PE, anesthetic complications, worsening in their symptoms, improvement in their symptoms, C SF leakage, epidural fibrosis, need for future surgeries such as revision discectomy and lumbar fusion. I also indicated that this is an operation to basically decompress the nerve root to allow recovery as opposed to fixing a herniated disc and that the incidence of recurrent chest disc herniation can approach 15%. Also that nerve root recovery is variable and may not recover completely.  I discussed the operative course including overnight in the hospital. Immediate ambulation. Follow-up in 2 weeks for suture removal. 6 weeks until healing of the herniation followed by 6 weeks of reconditioning and strengthening of the core musculature. Also discussed the need to employ the concepts of disc pressure management and core motion following the surgery to minimize the risk of recurrent disc herniation. We will obtain preoperative clearance i if necessary and proceed accordingly. We discussed the possibility of a week current synovial cyst  requiring excision or fusion.  Known history of MRSA or DVT. Insulin-dependent diabetes. Preoperative clearance  Plan microlumbar decompression L5-S1 left and excision of synovial cyst  Cecilie Kicks, PA-C for Dr. Tonita Cong 10/02/2020, 10:45 AM

## 2020-10-03 NOTE — Progress Notes (Signed)
Surgical Instructions    Your procedure is scheduled on Friday 10/06/2020.  Report to [facility] Main Entrance "A" at 10:30 A.M., then check in with the Admitting office.   Call this number if you have problems the morning of surgery:  [phone]    If you have any questions prior to your surgery date call [phone]: Open Monday-Friday 8am-4pm    Remember:  Do not eat after midnight the night before your surgery  You may drink clear liquids until 09:30 the morning of your surgery.   Clear liquids allowed are: Water, Non-Citrus Juices (without pulp), Carbonated Beverages, Clear Tea, Black Coffee Only, and Gatorade   Enhanced Recovery after Surgery for Orthopedics Enhanced Recovery after Surgery is a protocol used to improve the stress on your body and your recovery after surgery.  Patient Instructions  . The night before surgery:  o No food after midnight. ONLY clear liquids after midnight  . The day of surgery (if you have diabetes):  o Drink ONE small bottle of water by 09:30 am the morning of surgery o This bottle was given to you during your hospital  pre-op appointment visit.  o Nothing else to drink after completing the  Small bottle of water.         If you have questions, please contact your surgeon's office.     Take these medicines the morning of surgery with A SIP OF WATER: Amlodipine (Norvasc) Famotidine (Pepcid) Omeprazole (Prilosec) Pravastatin (Pravachol) - if it is scheduled  If needed you may take these medicines the morning of surgery with a sip of water: Gabapentin (Neurontin) Hydrocodone-acetaminophen (Norco/vicodin)   As of today, STOP taking any Aspirin or aspirin-containing products (unless otherwise instructed by your surgeon), Aleve, Naproxen, Ibuprofen, Motrin, Advil, Goody's, BC's, all herbal medications, supplements, fish oil, and all vitamins.   WHAT DO I DO ABOUT MY DIABETES MEDICATION?  . THE DAY BEFORE SURGERY, only take morning  or lunch dose of Glipizide (Glucotrol), Do not take an evening dose.  . THE NIGHT BEFORE SURGERY, DO NOT take bedtime dose of Insulin Lispro (Humalog), only usual doses before meals.  . THE NIGHT BEFORE SURGERY, take 30 units (1/2 of usual dose) of Insulin Detemir (Levemir).   . THE MORNING OF SURGERY, DO NOT take Glipizide (Glucotrol)     . THE MORNING OF SURGERY, DO NOT take Insulin Lispro (Humalog)  . IF your CBG is greater than 220 mg/dL when you wake up, you may take  of your usual correction dose of insulin.  HOW TO MANAGE YOUR DIABETES BEFORE AND AFTER SURGERY  Why is it important to control my blood sugar before and after surgery? . Improving blood sugar levels before and after surgery helps healing and can limit problems. . A way of improving blood sugar control is eating a healthy diet by: o  Eating less sugar and carbohydrates o  Increasing activity/exercise o  Talking with your doctor about reaching your blood sugar goals . High blood sugars (greater than 180 mg/dL) can raise your risk of infections and slow your recovery, so you will need to focus on controlling your diabetes during the weeks before surgery. . Make sure that the doctor who takes care of your diabetes knows about your planned surgery including the date and location.  How do I manage my blood sugar before surgery? . Check your blood sugar at least 4 times a day, starting 2 days before surgery, to make sure that the level is not too  high or low. . Check your blood sugar the morning of your surgery when you wake up and every 2 hours until you get to the Short Stay unit. o If your blood sugar is less than 70 mg/dL, you will need to treat for low blood sugar: - Do not take insulin. - Treat a low blood sugar (less than 70 mg/dL) with  cup of clear juice (cranberry or apple), 4 glucose tablets, OR glucose gel. - Recheck blood sugar in 15 minutes after treatment (to make sure it is greater than 70 mg/dL). If your  blood sugar is not greater than 70 mg/dL on recheck, call [phone] for further instructions. . Report your blood sugar to the short stay nurse when you get to Short Stay.  . If you are admitted to the hospital after surgery: o Your blood sugar will be checked by the staff and you will probably be given insulin after surgery (instead of oral diabetes medicines) to make sure you have good blood sugar levels. o The goal for blood sugar control after surgery is 80-180 mg/dL.           Do NOT Smoke (Tobacco/Vaping) or drink Alcohol 24 hours prior to your procedure  If you use a CPAP at night, you may bring all equipment for your overnight stay.   Contacts, glasses, hearing aids, dentures or partials may not be worn into surgery, please bring cases for these belongings   For patients admitted to the hospital, discharge time will be determined by your treatment team.   Patients discharged the day of surgery will not be allowed to drive home, and someone needs to stay with them for 24 hours.    Special instructions:   [place]- Preparing For Surgery  Before surgery, you can play an important role. Because skin is not sterile, your skin needs to be as free of germs as possible. You can reduce the number of germs on your skin by washing with CHG (chlorahexidine gluconate) Soap before surgery.  CHG is an antiseptic cleaner which kills germs and bonds with the skin to continue killing germs even after washing.    Oral Hygiene is also important to reduce your risk of infection.  Remember - BRUSH YOUR TEETH THE MORNING OF SURGERY WITH YOUR REGULAR TOOTHPASTE  Please do not use if you have an allergy to CHG or antibacterial soaps. If your skin becomes reddened/irritated stop using the CHG.  Do not shave (including legs and underarms) for at least 48 hours prior to first CHG shower. It is OK to shave your face.  Please follow these instructions carefully.   Do not shave 48 hours prior to  surgery.  Men may shave face and neck.  You are going to shower with CHG soap 2 different times.  The NIGHT BEFORE SURGERY/PROCEDURE and then again the MORNING OF SURGERY/PROCEDURE   1. If you chose to wash your hair, wash your hair first as usual with your normal shampoo.  2. After you shampoo, rinse your hair and body thoroughly to remove the shampoo.  3. Wash Face and genitals (private parts) with your normal soap.   4. THEN Shower with CHG Soap.   5. Use CHG as you would any other liquid soap. You can apply CHG directly to the skin and wash gently with a pouf/sponge or a clean washcloth.   6. Apply the CHG Soap to your body ONLY FROM THE NECK DOWN.  Do not use on open wounds or open  sores. Avoid contact with your eyes, ears, mouth and genitals (private parts). Wash Face and genitals (private parts)  with your normal soap.   7. Wash thoroughly, paying special attention to the area where your surgery will be performed.  8. Thoroughly rinse your body with warm water from the neck down.  9. DO NOT shower/wash with your normal soap after using and rinsing off the CHG Soap.  10. Pat yourself dry with a CLEAN TOWEL.  11. Wear CLEAN PAJAMAS to bed the night before surgery  12. Place CLEAN SHEETS on your bed the night before your surgery  13. DO NOT SLEEP WITH PETS.   Day of Surgery: Shower with CHG soap as directed.  Do not wear lotions, powders, perfumes, or deodorant.  Wear Clean/Comfortable clothing the morning of surgery  Remember to brush your teeth WITH YOUR REGULAR TOOTHPASTE.             Do not wear jewelry, make up, or nail polish  Do not bring valuables to the hospital.             [facility] is not responsible for any belongings or valuables.    Please read over the following fact sheets that you were given.

## 2020-10-04 ENCOUNTER — Other Ambulatory Visit: Payer: Self-pay

## 2020-10-04 ENCOUNTER — Other Ambulatory Visit (HOSPITAL_COMMUNITY)
Admission: RE | Admit: 2020-10-04 | Discharge: 2020-10-04 | Disposition: A | Discharge status: Home / Self Care | Payer: Medicare Other | Source: Ambulatory Visit | Attending: Specialist | Admitting: Specialist

## 2020-10-04 ENCOUNTER — Ambulatory Visit (HOSPITAL_COMMUNITY)
Admission: RE | Admit: 2020-10-04 | Discharge: 2020-10-04 | Disposition: A | Discharge status: Home / Self Care | Payer: Medicare Other | Source: Ambulatory Visit | Attending: Specialist | Admitting: Specialist

## 2020-10-04 ENCOUNTER — Encounter (HOSPITAL_COMMUNITY): Payer: Self-pay

## 2020-10-04 ENCOUNTER — Encounter (HOSPITAL_COMMUNITY)
Admission: RE | Admit: 2020-10-04 | Discharge: 2020-10-04 | Disposition: A | Discharge status: Home / Self Care | Payer: Medicare Other | Source: Ambulatory Visit | Attending: Specialist | Admitting: Specialist

## 2020-10-04 DIAGNOSIS — E119 Type 2 diabetes mellitus without complications: Secondary | ICD-10-CM | POA: Insufficient documentation

## 2020-10-04 DIAGNOSIS — Z20822 Contact with and (suspected) exposure to covid-19: Secondary | ICD-10-CM | POA: Insufficient documentation

## 2020-10-04 DIAGNOSIS — Z79899 Other long term (current) drug therapy: Secondary | ICD-10-CM | POA: Diagnosis not present

## 2020-10-04 DIAGNOSIS — I10 Essential (primary) hypertension: Secondary | ICD-10-CM | POA: Insufficient documentation

## 2020-10-04 DIAGNOSIS — Z794 Long term (current) use of insulin: Secondary | ICD-10-CM | POA: Diagnosis not present

## 2020-10-04 DIAGNOSIS — M7138 Other bursal cyst, other site: Secondary | ICD-10-CM | POA: Diagnosis not present

## 2020-10-04 DIAGNOSIS — K7581 Nonalcoholic steatohepatitis (NASH): Secondary | ICD-10-CM | POA: Diagnosis not present

## 2020-10-04 DIAGNOSIS — Z01812 Encounter for preprocedural laboratory examination: Secondary | ICD-10-CM | POA: Insufficient documentation

## 2020-10-04 DIAGNOSIS — M47816 Spondylosis without myelopathy or radiculopathy, lumbar region: Secondary | ICD-10-CM | POA: Diagnosis not present

## 2020-10-04 DIAGNOSIS — Z01818 Encounter for other preprocedural examination: Secondary | ICD-10-CM | POA: Insufficient documentation

## 2020-10-04 DIAGNOSIS — M5126 Other intervertebral disc displacement, lumbar region: Secondary | ICD-10-CM

## 2020-10-04 DIAGNOSIS — M4316 Spondylolisthesis, lumbar region: Secondary | ICD-10-CM | POA: Diagnosis not present

## 2020-10-04 DIAGNOSIS — E785 Hyperlipidemia, unspecified: Secondary | ICD-10-CM | POA: Diagnosis not present

## 2020-10-04 DIAGNOSIS — M4186 Other forms of scoliosis, lumbar region: Secondary | ICD-10-CM | POA: Diagnosis not present

## 2020-10-04 DIAGNOSIS — K219 Gastro-esophageal reflux disease without esophagitis: Secondary | ICD-10-CM | POA: Diagnosis not present

## 2020-10-04 HISTORY — DX: Personal history of urinary calculi: Z87.442

## 2020-10-04 LAB — BASIC METABOLIC PANEL
Anion gap: 11 (ref 5–15)
BUN: 25 mg/dL — ABNORMAL HIGH (ref 8–23)
CO2: 25 mmol/L (ref 22–32)
Calcium: 9.6 mg/dL (ref 8.9–10.3)
Chloride: 103 mmol/L (ref 98–111)
Creatinine, Ser: 0.94 mg/dL (ref 0.44–1.00)
GFR, Estimated: >60 mL/min (ref >60)
Glucose, Bld: 223 mg/dL — ABNORMAL HIGH (ref 70–99)
Potassium: 3.7 mmol/L (ref 3.5–5.1)
Sodium: 139 mmol/L (ref 135–145)

## 2020-10-04 LAB — CBC
HCT: 43.3 % (ref 36.0–46.0)
Hemoglobin: 14.5 g/dL (ref 12.0–15.0)
MCH: 31.4 pg (ref 26.0–34.0)
MCHC: 33.5 g/dL (ref 30.0–36.0)
MCV: 93.7 fL (ref 80.0–100.0)
Platelets: 241 10*3/uL (ref 150–400)
RBC: 4.62 MIL/uL (ref 3.87–5.11)
RDW: 13.4 % (ref 11.5–15.5)
WBC: 9.2 10*3/uL (ref 4.0–10.5)
nRBC: 0 % (ref 0.0–0.2)

## 2020-10-04 LAB — SURGICAL PCR SCREEN
MRSA, PCR: NEGATIVE
Staphylococcus aureus: NEGATIVE

## 2020-10-04 LAB — GLUCOSE, CAPILLARY: Glucose-Capillary: 208 mg/dL — ABNORMAL HIGH (ref 70–99)

## 2020-10-04 LAB — SARS CORONAVIRUS 2 (TAT 6-24 HRS): SARS Coronavirus 2: NEGATIVE

## 2020-10-04 LAB — HEMOGLOBIN A1C
Hgb A1c MFr Bld: 9.6 % — ABNORMAL HIGH (ref 4.8–5.6)
Mean Plasma Glucose: 228.82 mg/dL

## 2020-10-04 NOTE — Progress Notes (Signed)
PCP: Nelda Bucks, MD Cardiologist: Jyl Heinz, MD  EKG: 02/02/20 CXR: na ECHO: 02/21/20 Stress Test: 02/16/20 Cardiac Cath: denies  Fasting Blood Sugar- 120-230 Checks Blood Sugar__1-2_ times a day  OSA/CPAP:  No  ASA/Blood Thinners:  No  Covid test 10/04/20  Anesthesia Review:  No  Patient denies shortness of breath, fever, cough, and chest pain at PAT appointment.  Patient verbalized understanding of instructions provided today at the PAT appointment.  Patient asked to review instructions at home and day of surgery.

## 2020-10-05 NOTE — Anesthesia Preprocedure Evaluation (Addendum)
Anesthesia Evaluation  Patient identified by MRN, date of birth, ID band Patient awake    Reviewed: Allergy & Precautions, NPO status , Patient's Chart, lab work & pertinent test results  Airway Mallampati: II  TM Distance: >3 FB Neck ROM: Full    Dental no notable dental hx.    Pulmonary neg pulmonary ROS,    Pulmonary exam normal breath sounds clear to auscultation       Cardiovascular hypertension, Normal cardiovascular exam Rhythm:Regular Rate:Normal     Neuro/Psych negative neurological ROS  negative psych ROS   GI/Hepatic Neg liver ROS, GERD  ,  Endo/Other  diabetes, Type 2  Renal/GU negative Renal ROS  negative genitourinary   Musculoskeletal negative musculoskeletal ROS (+)   Abdominal   Peds negative pediatric ROS (+)  Hematology negative hematology ROS (+)   Anesthesia Other Findings   Reproductive/Obstetrics negative OB ROS                            Anesthesia Physical Anesthesia Plan  ASA: II  Anesthesia Plan: General   Post-op Pain Management:    Induction: Intravenous  PONV Risk Score and Plan: 3 and Ondansetron, Dexamethasone and Treatment may vary due to age or medical condition  Airway Management Planned: Oral ETT  Additional Equipment:   Intra-op Plan:   Post-operative Plan: Extubation in OR  Informed Consent: I have reviewed the patients History and Physical, chart, labs and discussed the procedure including the risks, benefits and alternatives for the proposed anesthesia with the patient or authorized representative who has indicated his/her understanding and acceptance.     Dental advisory given  Plan Discussed with: CRNA and Surgeon  Anesthesia Plan Comments: (PAT note written 10/05/2020 by Myra Gianotti, PA-C. )       Anesthesia Quick Evaluation

## 2020-10-05 NOTE — Progress Notes (Signed)
Anesthesia Chart Review:  Case: [id number] Date/Time: 10/06/20 1215   Procedure: Microlumbar decompression L5-S1 left and excision of synovial cyst (Left ) - 2.5 hrs   Anesthesia type: General   Pre-op diagnosis: Stenosis synovial cyst L5-S1 left   Location: [facility] OR ROOM 04 / [facility]   Surgeons: Susa Day, MD      DISCUSSION: Patient is a 75 year old female scheduled for the above procedure.  History includes never smoker, DM2, HLD, HTN, GERD, NASH, palpitations (PACs). BMI is consistent with obesity.   Last cardiology visit with Dr. Geraldo Pitter on 04/04/20 to follow-up on July 2021 stress and echo which where reassuring (no evidence of ischemia or infarct, EF > 55%, no significant valvular disease).   Preoperative A1c 9.6%, consistent with average glucose 228.82. She reported fasting CBGs ~ 120-130. A1c result routed to Dr. Tonita Cong, but I also called and spoke with staff at Dr. Reather Littler office to ask him to review results. If case remains as scheduled then she will get a CBG on arrival for surgery.  Preoperative COVID-19 test negative on 10/04/20. Anesthesia team to evaluate on the day of surgery.     VS: BP (!) 156/65   Pulse 66   Temp 36.6 C   Resp 18   Ht 5' (1.524 m)   Wt 79.3 kg   SpO2 96%   BMI 34.16 kg/m    PROVIDERS: Nicoletta Dress, MD is PCP  Jyl Heinz, MD is cardiologist   LABS: Preoperative labs noted. A1c 9.6%, up from 8.7% on 01/14/20 at [facility]. See DISCUSSION. (all labs ordered are listed, but only abnormal results are displayed)  Labs Reviewed  GLUCOSE, CAPILLARY - Abnormal; Notable for the following components:      Result Value   Glucose-Capillary 208 (*)    All other components within normal limits  BASIC METABOLIC PANEL - Abnormal; Notable for the following components:   Glucose, Bld 223 (*)    BUN 25 (*)    All other components within normal limits  HEMOGLOBIN A1C - Abnormal; Notable for the following components:   Hgb A1c MFr Bld 9.6  (*)    All other components within normal limits  SURGICAL PCR SCREEN  CBC     IMAGES: Xray L-spine 10/04/20: IMPRESSION: 1. Lumbar spine scoliosis concave right. 2. Diffuse multilevel degenerative change with prominent multilevel disc space loss and endplate osteophyte formation. 3 mm retrolisthesis L2 on L3, L3 on L4, L4 on L5. No acute bony abnormality identified. 3. Right upper quadrant calcifications. These could represent gallstones and or renal stones. Undigested pill fragment could also present this fashion.   EKG: 02/02/20: NSR, non-specific ST/T wave changes. Negative T wave lead III.   CV: Echo 02/21/20: IMPRESSIONS  1. Left ventricular ejection fraction, by estimation, is 60 to 65%. The  left ventricle has normal function. The left ventricle has no regional  wall motion abnormalities. Left ventricular diastolic parameters were  normal.  2. Right ventricular systolic function is normal. The right ventricular  size is normal. There is normal pulmonary artery systolic pressure.  3. The mitral valve is normal in structure. No evidence of mitral valve  regurgitation. No evidence of mitral stenosis.  4. The aortic valve is tricuspid. Aortic valve regurgitation is not  visualized. Mild aortic valve sclerosis is present, with no evidence of  aortic valve stenosis.  5. The inferior vena cava is normal in size with greater than 50%  respiratory variability, suggesting right atrial pressure of  3 mmHg.    Nuclear stress test 02/16/20:  The left ventricular ejection fraction is hyperdynamic (>65%).  Nuclear stress EF: 82%.  There was no ST segment deviation noted during stress.  This is a low risk study.  No evidence of ischemia or MI.  Normal EF.    Initially a [employer] long term monitor was ordered in July 2021. By 02/24/20 note by Jerl Santos, CMA, "Pt had no problem wearing the monitor. She stated wore it the number of days prescribed and then mailed it  back. Monitor came back with no data on it." At 04/04/20 follow-up visit with Dr. Geraldo Pitter, patient discussed with him no plan to reattempt long term monitor at this time since her palpitations had resolved.    Past Medical History:  Diagnosis Date  . APC (atrial premature contractions)   . Cataract    Left  . Chronic GERD   . Chronic insomnia   . Degeneration of lumbar intervertebral disc   . Facet arthritis of lumbar region   . History of kidney stones   . Hyperlipidemia   . Hypertension   . Hypopotassemia   . Leg cramps   . Menopausal state   . Non-alcoholic fatty liver disease   . Osteopenia of multiple sites   . Type 2 diabetes, uncontrolled, with retinopathy ([place])   . Uncontrolled type 2 diabetes mellitus with peripheral neuropathy ([place])   . Vitamin D deficiency     Past Surgical History:  Procedure Laterality Date  . TOTAL KNEE ARTHROPLASTY      MEDICATIONS: . alendronate (FOSAMAX) 70 MG tablet  . amLODipine (NORVASC) 5 MG tablet  . augmented betamethasone dipropionate (DIPROLENE-AF) 0.05 % cream  . Calcium Carbonate-Vit D-Min (RA CALCIUM 600/VIT D/MINERALS) 600-200 MG-UNIT TABS  . famotidine (PEPCID) 20 MG tablet  . furosemide (LASIX) 40 MG tablet  . gabapentin (NEURONTIN) 100 MG capsule  . glipiZIDE (GLUCOTROL) 5 MG tablet  . HYDROcodone-acetaminophen (NORCO/VICODIN) 5-325 MG tablet  . IBU 800 MG tablet  . insulin detemir (LEVEMIR FLEXTOUCH) 100 UNIT/ML FlexPen  . insulin lispro (HUMALOG KWIKPEN) 200 UNIT/ML KwikPen  . lisinopril (ZESTRIL) 40 MG tablet  . omeprazole (PRILOSEC) 20 MG capsule  . pravastatin (PRAVACHOL) 20 MG tablet  . PRED FORTE 1 % ophthalmic suspension  . zolpidem (AMBIEN) 10 MG tablet   No current facility-administered medications for this encounter.    Myra Gianotti, PA-C Surgical Short Stay/Anesthesiology [facility] Phone [phone] [facility] Phone [phone] 10/05/2020 9:38 AM

## 2020-10-06 ENCOUNTER — Other Ambulatory Visit: Payer: Self-pay

## 2020-10-06 ENCOUNTER — Ambulatory Visit (HOSPITAL_COMMUNITY)
Admission: RE | Disposition: A | Discharge status: Home / Self Care | Payer: Self-pay | Source: Home / Self Care | Attending: Specialist

## 2020-10-06 ENCOUNTER — Ambulatory Visit (HOSPITAL_COMMUNITY)
Admission: RE | Admit: 2020-10-06 | Discharge: 2020-10-07 | Disposition: A | Discharge status: Home / Self Care | Payer: Medicare Other | Attending: Specialist | Admitting: Specialist

## 2020-10-06 ENCOUNTER — Ambulatory Visit (HOSPITAL_COMMUNITY): Payer: Medicare Other | Admitting: Certified Registered"

## 2020-10-06 ENCOUNTER — Ambulatory Visit (HOSPITAL_COMMUNITY): Payer: Medicare Other

## 2020-10-06 ENCOUNTER — Encounter (HOSPITAL_COMMUNITY): Payer: Self-pay | Admitting: Specialist

## 2020-10-06 ENCOUNTER — Ambulatory Visit (HOSPITAL_COMMUNITY): Payer: Medicare Other | Admitting: Vascular Surgery

## 2020-10-06 DIAGNOSIS — I10 Essential (primary) hypertension: Secondary | ICD-10-CM

## 2020-10-06 DIAGNOSIS — E669 Obesity, unspecified: Secondary | ICD-10-CM | POA: Diagnosis present

## 2020-10-06 DIAGNOSIS — M48061 Spinal stenosis, lumbar region without neurogenic claudication: Secondary | ICD-10-CM | POA: Diagnosis present

## 2020-10-06 DIAGNOSIS — M48062 Spinal stenosis, lumbar region with neurogenic claudication: Secondary | ICD-10-CM | POA: Diagnosis not present

## 2020-10-06 DIAGNOSIS — Z419 Encounter for procedure for purposes other than remedying health state, unspecified: Secondary | ICD-10-CM

## 2020-10-06 DIAGNOSIS — M4727 Other spondylosis with radiculopathy, lumbosacral region: Secondary | ICD-10-CM | POA: Diagnosis not present

## 2020-10-06 DIAGNOSIS — E1169 Type 2 diabetes mellitus with other specified complication: Secondary | ICD-10-CM | POA: Diagnosis not present

## 2020-10-06 DIAGNOSIS — E6609 Other obesity due to excess calories: Secondary | ICD-10-CM

## 2020-10-06 DIAGNOSIS — E782 Mixed hyperlipidemia: Secondary | ICD-10-CM

## 2020-10-06 DIAGNOSIS — Z91013 Allergy to seafood: Secondary | ICD-10-CM | POA: Diagnosis not present

## 2020-10-06 DIAGNOSIS — E1142 Type 2 diabetes mellitus with diabetic polyneuropathy: Secondary | ICD-10-CM | POA: Insufficient documentation

## 2020-10-06 DIAGNOSIS — M7138 Other bursal cyst, other site: Secondary | ICD-10-CM | POA: Diagnosis not present

## 2020-10-06 DIAGNOSIS — R079 Chest pain, unspecified: Secondary | ICD-10-CM

## 2020-10-06 DIAGNOSIS — Z981 Arthrodesis status: Secondary | ICD-10-CM | POA: Diagnosis not present

## 2020-10-06 DIAGNOSIS — E119 Type 2 diabetes mellitus without complications: Secondary | ICD-10-CM | POA: Diagnosis not present

## 2020-10-06 DIAGNOSIS — Z794 Long term (current) use of insulin: Secondary | ICD-10-CM | POA: Diagnosis not present

## 2020-10-06 DIAGNOSIS — Z8614 Personal history of Methicillin resistant Staphylococcus aureus infection: Secondary | ICD-10-CM | POA: Diagnosis not present

## 2020-10-06 DIAGNOSIS — M4807 Spinal stenosis, lumbosacral region: Secondary | ICD-10-CM | POA: Diagnosis not present

## 2020-10-06 DIAGNOSIS — M4157 Other secondary scoliosis, lumbosacral region: Secondary | ICD-10-CM | POA: Diagnosis not present

## 2020-10-06 DIAGNOSIS — R002 Palpitations: Secondary | ICD-10-CM

## 2020-10-06 DIAGNOSIS — Z6833 Body mass index (BMI) 33.0-33.9, adult: Secondary | ICD-10-CM

## 2020-10-06 DIAGNOSIS — M25551 Pain in right hip: Secondary | ICD-10-CM

## 2020-10-06 DIAGNOSIS — R011 Cardiac murmur, unspecified: Secondary | ICD-10-CM

## 2020-10-06 HISTORY — DX: Spinal stenosis, lumbar region without neurogenic claudication: M48.061

## 2020-10-06 HISTORY — PX: LUMBAR LAMINECTOMY/DECOMPRESSION MICRODISCECTOMY: SHX5026

## 2020-10-06 HISTORY — DX: Other obesity due to excess calories: E66.09

## 2020-10-06 LAB — GLUCOSE, CAPILLARY
Glucose-Capillary: 232 mg/dL — ABNORMAL HIGH (ref 70–99)
Glucose-Capillary: 226 mg/dL — ABNORMAL HIGH (ref 70–99)
Glucose-Capillary: 209 mg/dL — ABNORMAL HIGH (ref 70–99)
Glucose-Capillary: 145 mg/dL — ABNORMAL HIGH (ref 70–99)
Glucose-Capillary: 254 mg/dL — ABNORMAL HIGH (ref 70–99)
Glucose-Capillary: 317 mg/dL — ABNORMAL HIGH (ref 70–99)

## 2020-10-06 SURGERY — LUMBAR LAMINECTOMY/DECOMPRESSION MICRODISCECTOMY 1 LEVEL
Anesthesia: General | Site: Spine Lumbar | Laterality: Left

## 2020-10-06 MED ORDER — CHLORHEXIDINE GLUCONATE 0.12 % MT SOLN
OROMUCOSAL | Status: AC
  Administered 2020-10-06: 15 mL via OROMUCOSAL
  Filled 2020-10-06: qty 15

## 2020-10-06 MED ORDER — BUPIVACAINE HCL (PF) 0.5 % IJ SOLN
INTRAMUSCULAR | Status: DC | PRN
  Administered 2020-10-06: 5 mL

## 2020-10-06 MED ORDER — PROPOFOL 10 MG/ML IV BOLUS
INTRAVENOUS | Status: DC | PRN
  Administered 2020-10-06: 140 mg via INTRAVENOUS

## 2020-10-06 MED ORDER — PHENYLEPHRINE 40 MCG/ML (10ML) SYRINGE FOR IV PUSH (FOR BLOOD PRESSURE SUPPORT)
PREFILLED_SYRINGE | INTRAVENOUS | Status: DC | PRN
  Administered 2020-10-06 (×4): 40 ug via INTRAVENOUS

## 2020-10-06 MED ORDER — LACTATED RINGERS IV SOLN: INTRAVENOUS | Status: DC

## 2020-10-06 MED ORDER — AMLODIPINE BESYLATE 5 MG PO TABS: 5.0000 mg | ORAL_TABLET | Freq: Every day | ORAL | Status: DC

## 2020-10-06 MED ORDER — INSULIN ASPART 100 UNIT/ML ~~LOC~~ SOLN: 0.0000 [IU] | Freq: Three times a day (TID) | SUBCUTANEOUS | Status: DC

## 2020-10-06 MED ORDER — THROMBIN 20000 UNITS EX SOLR: CUTANEOUS | Status: DC | PRN

## 2020-10-06 MED ORDER — PHENYLEPHRINE 40 MCG/ML (10ML) SYRINGE FOR IV PUSH (FOR BLOOD PRESSURE SUPPORT)
PREFILLED_SYRINGE | INTRAVENOUS | Status: AC
  Filled 2020-10-06: qty 10

## 2020-10-06 MED ORDER — DOCUSATE SODIUM 100 MG PO CAPS: 100.0000 mg | ORAL_CAPSULE | Freq: Two times a day (BID) | ORAL | 1 refills | Status: DC | PRN

## 2020-10-06 MED ORDER — EPINEPHRINE PF 1 MG/ML IJ SOLN
INTRAMUSCULAR | Status: DC | PRN
  Administered 2020-10-06: .15 mL

## 2020-10-06 MED ORDER — THROMBIN (RECOMBINANT) 20000 UNITS EX SOLR
CUTANEOUS | Status: AC
  Filled 2020-10-06: qty 20000

## 2020-10-06 MED ORDER — ROCURONIUM BROMIDE 10 MG/ML (PF) SYRINGE
PREFILLED_SYRINGE | INTRAVENOUS | Status: AC
  Filled 2020-10-06: qty 10

## 2020-10-06 MED ORDER — POLYETHYLENE GLYCOL 3350 17 G PO PACK: 17.0000 g | PACK | Freq: Every day | ORAL | 0 refills | Status: DC

## 2020-10-06 MED ORDER — CEFAZOLIN SODIUM-DEXTROSE 2-4 GM/100ML-% IV SOLN
2.0000 g | Freq: Three times a day (TID) | INTRAVENOUS | Status: AC
  Administered 2020-10-06 – 2020-10-07 (×2): 2 g via INTRAVENOUS
  Filled 2020-10-06 (×2): qty 100

## 2020-10-06 MED ORDER — OXYCODONE HCL 5 MG PO TABS: 5.0000 mg | ORAL_TABLET | ORAL | Status: DC | PRN

## 2020-10-06 MED ORDER — INSULIN ASPART 100 UNIT/ML ~~LOC~~ SOLN
15.0000 [IU] | Freq: Three times a day (TID) | SUBCUTANEOUS | Status: DC
  Administered 2020-10-06 – 2020-10-07 (×2): 15 [IU] via SUBCUTANEOUS

## 2020-10-06 MED ORDER — ACETAMINOPHEN 650 MG RE SUPP: 650.0000 mg | RECTAL | Status: DC | PRN

## 2020-10-06 MED ORDER — EPHEDRINE 5 MG/ML INJ
INTRAVENOUS | Status: AC
  Filled 2020-10-06: qty 10

## 2020-10-06 MED ORDER — ONDANSETRON HCL 4 MG/2ML IJ SOLN: 4.0000 mg | Freq: Four times a day (QID) | INTRAMUSCULAR | Status: DC | PRN

## 2020-10-06 MED ORDER — ACETAMINOPHEN 500 MG PO TABS
1000.0000 mg | ORAL_TABLET | Freq: Four times a day (QID) | ORAL | Status: DC
  Administered 2020-10-06: 1000 mg via ORAL
  Filled 2020-10-06 (×2): qty 2

## 2020-10-06 MED ORDER — LIDOCAINE 2% (20 MG/ML) 5 ML SYRINGE
INTRAMUSCULAR | Status: AC
  Filled 2020-10-06: qty 5

## 2020-10-06 MED ORDER — OXYCODONE HCL 5 MG PO TABS: 5.0000 mg | ORAL_TABLET | Freq: Once | ORAL | Status: DC | PRN

## 2020-10-06 MED ORDER — EPINEPHRINE PF 1 MG/ML IJ SOLN
INTRAMUSCULAR | Status: AC
  Filled 2020-10-06: qty 1

## 2020-10-06 MED ORDER — TRANEXAMIC ACID-NACL 1000-0.7 MG/100ML-% IV SOLN
INTRAVENOUS | Status: DC | PRN
  Administered 2020-10-06: 1000 mg via INTRAVENOUS

## 2020-10-06 MED ORDER — POLYETHYLENE GLYCOL 3350 17 G PO PACK: 17.0000 g | PACK | Freq: Every day | ORAL | Status: DC | PRN

## 2020-10-06 MED ORDER — HYDROCODONE-ACETAMINOPHEN 7.5-325 MG PO TABS
1.0000 | ORAL_TABLET | ORAL | Status: DC | PRN
  Administered 2020-10-06 – 2020-10-07 (×3): 1 via ORAL
  Filled 2020-10-06 (×3): qty 1

## 2020-10-06 MED ORDER — BUPIVACAINE HCL (PF) 0.5 % IJ SOLN
INTRAMUSCULAR | Status: AC
  Filled 2020-10-06: qty 30

## 2020-10-06 MED ORDER — LIDOCAINE 2% (20 MG/ML) 5 ML SYRINGE
INTRAMUSCULAR | Status: DC | PRN
  Administered 2020-10-06: 100 mg via INTRAVENOUS

## 2020-10-06 MED ORDER — ACETAMINOPHEN 10 MG/ML IV SOLN
INTRAVENOUS | Status: AC
  Filled 2020-10-06: qty 100

## 2020-10-06 MED ORDER — CEFAZOLIN SODIUM-DEXTROSE 2-4 GM/100ML-% IV SOLN
2.0000 g | INTRAVENOUS | Status: AC
  Administered 2020-10-06: 2 g via INTRAVENOUS

## 2020-10-06 MED ORDER — PROPOFOL 10 MG/ML IV BOLUS
INTRAVENOUS | Status: AC
  Filled 2020-10-06: qty 20

## 2020-10-06 MED ORDER — OXYCODONE-ACETAMINOPHEN 5-325 MG PO TABS: 1.0000 | ORAL_TABLET | ORAL | 0 refills | Status: DC | PRN

## 2020-10-06 MED ORDER — GABAPENTIN 100 MG PO CAPS
100.0000 mg | ORAL_CAPSULE | Freq: Three times a day (TID) | ORAL | Status: DC | PRN
  Administered 2020-10-06: 100 mg via ORAL
  Filled 2020-10-06: qty 1

## 2020-10-06 MED ORDER — DOCUSATE SODIUM 100 MG PO CAPS
100.0000 mg | ORAL_CAPSULE | Freq: Two times a day (BID) | ORAL | Status: DC
  Administered 2020-10-06: 100 mg via ORAL
  Filled 2020-10-06: qty 1

## 2020-10-06 MED ORDER — PHENYLEPHRINE HCL (PRESSORS) 10 MG/ML IV SOLN
INTRAVENOUS | Status: AC
  Filled 2020-10-06: qty 1

## 2020-10-06 MED ORDER — CALCIUM CARBONATE-VITAMIN D 500-200 MG-UNIT PO TABS
1.0000 | ORAL_TABLET | Freq: Every day | ORAL | Status: DC
  Administered 2020-10-07: 09:00:00 1 via ORAL
  Filled 2020-10-06: qty 1

## 2020-10-06 MED ORDER — FENTANYL CITRATE (PF) 250 MCG/5ML IJ SOLN
INTRAMUSCULAR | Status: AC
  Filled 2020-10-06: qty 5

## 2020-10-06 MED ORDER — FENTANYL CITRATE (PF) 100 MCG/2ML IJ SOLN
INTRAMUSCULAR | Status: DC | PRN
  Administered 2020-10-06 (×3): 50 ug via INTRAVENOUS

## 2020-10-06 MED ORDER — HYDROMORPHONE HCL 1 MG/ML IJ SOLN
0.2500 mg | INTRAMUSCULAR | Status: DC | PRN
  Administered 2020-10-06 (×2): 0.5 mg via INTRAVENOUS

## 2020-10-06 MED ORDER — ROCURONIUM BROMIDE 100 MG/10ML IV SOLN
INTRAVENOUS | Status: DC | PRN
  Administered 2020-10-06: 80 mg via INTRAVENOUS

## 2020-10-06 MED ORDER — 0.9 % SODIUM CHLORIDE (POUR BTL) OPTIME
TOPICAL | Status: DC | PRN
  Administered 2020-10-06: 1000 mL

## 2020-10-06 MED ORDER — PREDNISOLONE ACETATE 1 % OP SUSP
1.0000 [drp] | Freq: Every day | OPHTHALMIC | Status: DC
  Administered 2020-10-06: 1 [drp] via OPHTHALMIC
  Filled 2020-10-06: qty 5

## 2020-10-06 MED ORDER — LISINOPRIL 20 MG PO TABS: 40.0000 mg | ORAL_TABLET | Freq: Every day | ORAL | Status: DC

## 2020-10-06 MED ORDER — MAGNESIUM CITRATE PO SOLN: 1.0000 | Freq: Once | ORAL | Status: DC | PRN

## 2020-10-06 MED ORDER — RA CALCIUM 600/VIT D/MINERALS 600-200 MG-UNIT PO TABS: 1.0000 | ORAL_TABLET | Freq: Every day | ORAL | Status: DC

## 2020-10-06 MED ORDER — METHOCARBAMOL 500 MG PO TABS
500.0000 mg | ORAL_TABLET | Freq: Four times a day (QID) | ORAL | Status: DC | PRN
  Administered 2020-10-06 – 2020-10-07 (×2): 500 mg via ORAL
  Filled 2020-10-06 (×2): qty 1

## 2020-10-06 MED ORDER — HYDROXYZINE HCL 50 MG/ML IM SOLN
50.0000 mg | Freq: Four times a day (QID) | INTRAMUSCULAR | Status: DC | PRN
  Administered 2020-10-06: 50 mg via INTRAMUSCULAR
  Filled 2020-10-06: qty 1

## 2020-10-06 MED ORDER — ACETAMINOPHEN 10 MG/ML IV SOLN
1000.0000 mg | INTRAVENOUS | Status: AC
  Administered 2020-10-06: 1000 mg via INTRAVENOUS

## 2020-10-06 MED ORDER — INSULIN ASPART 100 UNIT/ML ~~LOC~~ SOLN
SUBCUTANEOUS | Status: AC
  Filled 2020-10-06: qty 1

## 2020-10-06 MED ORDER — ONDANSETRON HCL 4 MG/2ML IJ SOLN
INTRAMUSCULAR | Status: AC
  Filled 2020-10-06: qty 2

## 2020-10-06 MED ORDER — INSULIN ASPART 100 UNIT/ML ~~LOC~~ SOLN
0.0000 [IU] | Freq: Every day | SUBCUTANEOUS | Status: DC
  Administered 2020-10-06: 4 [IU] via SUBCUTANEOUS

## 2020-10-06 MED ORDER — ZOLPIDEM TARTRATE 5 MG PO TABS: 5.0000 mg | ORAL_TABLET | Freq: Every evening | ORAL | Status: DC | PRN

## 2020-10-06 MED ORDER — MENTHOL 3 MG MT LOZG: 1.0000 | LOZENGE | OROMUCOSAL | Status: DC | PRN

## 2020-10-06 MED ORDER — INSULIN DETEMIR 100 UNIT/ML ~~LOC~~ SOLN
60.0000 [IU] | Freq: Every day | SUBCUTANEOUS | Status: DC
  Administered 2020-10-06: 60 [IU] via SUBCUTANEOUS
  Filled 2020-10-06 (×2): qty 0.6

## 2020-10-06 MED ORDER — ONDANSETRON HCL 4 MG/2ML IJ SOLN
INTRAMUSCULAR | Status: DC | PRN
  Administered 2020-10-06 (×2): 4 mg via INTRAVENOUS

## 2020-10-06 MED ORDER — TRIAMCINOLONE ACETONIDE 0.5 % EX CREA
TOPICAL_CREAM | Freq: Every day | CUTANEOUS | Status: DC
  Filled 2020-10-06: qty 15

## 2020-10-06 MED ORDER — ONDANSETRON HCL 4 MG/2ML IJ SOLN
4.0000 mg | Freq: Once | INTRAMUSCULAR | Status: AC | PRN
  Administered 2020-10-06: 4 mg via INTRAVENOUS

## 2020-10-06 MED ORDER — PANTOPRAZOLE SODIUM 40 MG PO TBEC: 40.0000 mg | DELAYED_RELEASE_TABLET | Freq: Every day | ORAL | Status: DC

## 2020-10-06 MED ORDER — METHOCARBAMOL 1000 MG/10ML IJ SOLN
500.0000 mg | Freq: Four times a day (QID) | INTRAVENOUS | Status: DC | PRN
  Filled 2020-10-06: qty 5

## 2020-10-06 MED ORDER — HYDROMORPHONE HCL 1 MG/ML IJ SOLN
INTRAMUSCULAR | Status: AC
  Filled 2020-10-06: qty 1

## 2020-10-06 MED ORDER — FAMOTIDINE 20 MG PO TABS
20.0000 mg | ORAL_TABLET | Freq: Two times a day (BID) | ORAL | Status: DC
  Administered 2020-10-06: 20 mg via ORAL
  Filled 2020-10-06: qty 1

## 2020-10-06 MED ORDER — EPHEDRINE SULFATE-NACL 50-0.9 MG/10ML-% IV SOSY
PREFILLED_SYRINGE | INTRAVENOUS | Status: DC | PRN
  Administered 2020-10-06: 5 mg via INTRAVENOUS

## 2020-10-06 MED ORDER — POTASSIUM CHLORIDE IN NACL 20-0.9 MEQ/L-% IV SOLN: INTRAVENOUS | Status: DC

## 2020-10-06 MED ORDER — INSULIN LISPRO 200 UNIT/ML ~~LOC~~ SOPN: 15.0000 [IU] | PEN_INJECTOR | Freq: Three times a day (TID) | SUBCUTANEOUS | Status: DC

## 2020-10-06 MED ORDER — ACETAMINOPHEN 325 MG PO TABS: 650.0000 mg | ORAL_TABLET | ORAL | Status: DC | PRN

## 2020-10-06 MED ORDER — SUGAMMADEX SODIUM 200 MG/2ML IV SOLN
INTRAVENOUS | Status: DC | PRN
  Administered 2020-10-06 (×2): 50 mg via INTRAVENOUS

## 2020-10-06 MED ORDER — ALUM & MAG HYDROXIDE-SIMETH 200-200-20 MG/5ML PO SUSP: 30.0000 mL | Freq: Four times a day (QID) | ORAL | Status: DC | PRN

## 2020-10-06 MED ORDER — BISACODYL 5 MG PO TBEC: 5.0000 mg | DELAYED_RELEASE_TABLET | Freq: Every day | ORAL | Status: DC | PRN

## 2020-10-06 MED ORDER — CHLORHEXIDINE GLUCONATE 0.12 % MT SOLN: 15.0000 mL | Freq: Once | OROMUCOSAL | Status: AC

## 2020-10-06 MED ORDER — ORAL CARE MOUTH RINSE: 15.0000 mL | Freq: Once | OROMUCOSAL | Status: AC

## 2020-10-06 MED ORDER — CEFAZOLIN SODIUM-DEXTROSE 2-4 GM/100ML-% IV SOLN
INTRAVENOUS | Status: AC
  Filled 2020-10-06: qty 100

## 2020-10-06 MED ORDER — PRAVASTATIN SODIUM 10 MG PO TABS: 20.0000 mg | ORAL_TABLET | ORAL | Status: DC

## 2020-10-06 MED ORDER — PHENYLEPHRINE HCL-NACL 10-0.9 MG/250ML-% IV SOLN
INTRAVENOUS | Status: DC | PRN
  Administered 2020-10-06: 20 ug/min via INTRAVENOUS

## 2020-10-06 MED ORDER — TRANEXAMIC ACID-NACL 1000-0.7 MG/100ML-% IV SOLN
INTRAVENOUS | Status: AC
  Filled 2020-10-06: qty 100

## 2020-10-06 MED ORDER — HYDROMORPHONE HCL 1 MG/ML IJ SOLN: 0.5000 mg | INTRAMUSCULAR | Status: DC | PRN

## 2020-10-06 MED ORDER — OXYCODONE HCL 5 MG/5ML PO SOLN: 5.0000 mg | Freq: Once | ORAL | Status: DC | PRN

## 2020-10-06 MED ORDER — ONDANSETRON HCL 4 MG PO TABS: 4.0000 mg | ORAL_TABLET | Freq: Four times a day (QID) | ORAL | Status: DC | PRN

## 2020-10-06 MED ORDER — INSULIN ASPART 100 UNIT/ML ~~LOC~~ SOLN
6.0000 [IU] | Freq: Once | SUBCUTANEOUS | Status: AC
  Administered 2020-10-06: 6 [IU] via SUBCUTANEOUS

## 2020-10-06 MED ORDER — PHENOL 1.4 % MT LIQD: 1.0000 | OROMUCOSAL | Status: DC | PRN

## 2020-10-06 MED ORDER — FUROSEMIDE 40 MG PO TABS
40.0000 mg | ORAL_TABLET | Freq: Every day | ORAL | Status: DC
  Administered 2020-10-06: 40 mg via ORAL
  Filled 2020-10-06: qty 1

## 2020-10-06 SURGICAL SUPPLY — 51 items
BAG DECANTER FOR FLEXI CONT (MISCELLANEOUS) ×2 IMPLANT
BAND RUBBER #18 3X1/16 STRL (MISCELLANEOUS) ×4 IMPLANT
CLEANER TIP ELECTROSURG 2X2 (MISCELLANEOUS) ×2 IMPLANT
CNTNR URN SCR LID CUP LEK RST (MISCELLANEOUS) ×1 IMPLANT
CONT SPEC 4OZ STRL OR WHT (MISCELLANEOUS) ×1
COVER WAND RF STERILE (DRAPES) ×2 IMPLANT
DRAPE LAPAROTOMY 100X72X124 (DRAPES) ×2 IMPLANT
DRAPE MICROSCOPE LEICA (MISCELLANEOUS) ×2 IMPLANT
DRAPE SHEET LG 3/4 BI-LAMINATE (DRAPES) ×2 IMPLANT
DRAPE SURG 17X11 SM STRL (DRAPES) ×2 IMPLANT
DRAPE UTILITY XL STRL (DRAPES) ×2 IMPLANT
DRSG AQUACEL AG ADV 3.5X 4 (GAUZE/BANDAGES/DRESSINGS) IMPLANT
DRSG AQUACEL AG ADV 3.5X 6 (GAUZE/BANDAGES/DRESSINGS) ×2 IMPLANT
DRSG TELFA 3X8 NADH (GAUZE/BANDAGES/DRESSINGS) IMPLANT
DURAPREP 26ML APPLICATOR (WOUND CARE) ×2 IMPLANT
DURASEAL SPINE SEALANT 3ML (MISCELLANEOUS) IMPLANT
ELECT BLADE 4.0 EZ CLEAN MEGAD (MISCELLANEOUS)
ELECT REM PT RETURN 9FT ADLT (ELECTROSURGICAL) ×2
ELECTRODE BLDE 4.0 EZ CLN MEGD (MISCELLANEOUS) IMPLANT
ELECTRODE REM PT RTRN 9FT ADLT (ELECTROSURGICAL) ×1 IMPLANT
GLOVE SURG SS PI 7.5 STRL IVOR (GLOVE) ×2 IMPLANT
GLOVE SURG SS PI 8.0 STRL IVOR (GLOVE) ×4 IMPLANT
GLOVE SURG UNDER POLY LF SZ7 (GLOVE) ×2 IMPLANT
GOWN STRL REUS W/ TWL LRG LVL3 (GOWN DISPOSABLE) ×1 IMPLANT
GOWN STRL REUS W/ TWL XL LVL3 (GOWN DISPOSABLE) ×1 IMPLANT
GOWN STRL REUS W/TWL LRG LVL3 (GOWN DISPOSABLE) ×1
GOWN STRL REUS W/TWL XL LVL3 (GOWN DISPOSABLE) ×1
IV CATH 14GX2 1/4 (CATHETERS) ×2 IMPLANT
KIT BASIN OR (CUSTOM PROCEDURE TRAY) ×2 IMPLANT
NEEDLE 22X1 1/2 (OR ONLY) (NEEDLE) ×2 IMPLANT
NEEDLE SPNL 18GX3.5 QUINCKE PK (NEEDLE) ×4 IMPLANT
PACK LAMINECTOMY NEURO (CUSTOM PROCEDURE TRAY) ×2 IMPLANT
PATTIES SURGICAL .75X.75 (GAUZE/BANDAGES/DRESSINGS) ×2 IMPLANT
SPONGE LAP 4X18 RFD (DISPOSABLE) IMPLANT
SPONGE SURGIFOAM ABS GEL 100 (HEMOSTASIS) ×2 IMPLANT
STAPLER VISISTAT (STAPLE) IMPLANT
STRIP CLOSURE SKIN 1/2X4 (GAUZE/BANDAGES/DRESSINGS) ×2 IMPLANT
SUT NURALON 4 0 TR CR/8 (SUTURE) IMPLANT
SUT PROLENE 3 0 PS 2 (SUTURE) IMPLANT
SUT VIC AB 1 CT1 27 (SUTURE)
SUT VIC AB 1 CT1 27XBRD ANTBC (SUTURE) IMPLANT
SUT VIC AB 1 CT1 36 (SUTURE) ×2 IMPLANT
SUT VIC AB 1-0 CT2 27 (SUTURE) IMPLANT
SUT VIC AB 2-0 CT1 27 (SUTURE)
SUT VIC AB 2-0 CT1 TAPERPNT 27 (SUTURE) IMPLANT
SUT VIC AB 2-0 CT2 27 (SUTURE) IMPLANT
SYR 3ML LL SCALE MARK (SYRINGE) ×2 IMPLANT
TOWEL GREEN STERILE (TOWEL DISPOSABLE) ×2 IMPLANT
TOWEL GREEN STERILE FF (TOWEL DISPOSABLE) ×2 IMPLANT
TRAY FOLEY MTR SLVR 16FR STAT (SET/KITS/TRAYS/PACK) ×2 IMPLANT
YANKAUER SUCT BULB TIP NO VENT (SUCTIONS) ×2 IMPLANT

## 2020-10-06 NOTE — Anesthesia Procedure Notes (Signed)
Procedure Name: Intubation Date/Time: 10/06/2020 12:47 PM Performed by: Gwyndolyn Saxon, CRNA Pre-anesthesia Checklist: Patient identified, Emergency Drugs available, Suction available and Patient being monitored Patient Re-evaluated:Patient Re-evaluated prior to induction Oxygen Delivery Method: Circle system utilized Preoxygenation: Pre-oxygenation with 100% oxygen Induction Type: IV induction Ventilation: Mask ventilation without difficulty and Oral airway inserted - appropriate to patient size Laryngoscope Size: Sabra Heck and 2 Grade View: Grade I Tube type: Oral Tube size: 6.5 mm Number of attempts: 1 Airway Equipment and Method: Patient positioned with wedge pillow and Stylet Placement Confirmation: ETT inserted through vocal cords under direct vision,  positive ETCO2 and breath sounds checked- equal and bilateral Secured at: 20 cm Tube secured with: Tape Dental Injury: Teeth and Oropharynx as per pre-operative assessment

## 2020-10-06 NOTE — Brief Op Note (Signed)
10/06/2020  3:58 PM  PATIENT:  Casey Watkins  75 y.o. female  PRE-OPERATIVE DIAGNOSIS:  Stenosis synovial cyst L5-S1 left  POST-OPERATIVE DIAGNOSIS:  Stenosis synovial cyst L5-S1 left  PROCEDURE:  Procedure(s) with comments: Microlumbar decompression Lumbar five through Sacral one left and excision of synovial cyst (Left) - 2.5 hrs  SURGEON:  Surgeon(s) and Role:    Susa Day, MD - Primary  PHYSICIAN ASSISTANT:   ASSISTANTS: [place]   ANESTHESIA:   spinal  EBL:  150 mL   BLOOD ADMINISTERED:none  DRAINS: none   LOCAL MEDICATIONS USED:  MARCAINE     SPECIMEN:  Source of Specimen:  cyst L5S1  DISPOSITION OF SPECIMEN:  PATHOLOGY  COUNTS:  YES  TOURNIQUET:  * No tourniquets in log *  DICTATION: .Other Dictation: Dictation Number [id number]  PLAN OF CARE: Admit for overnight observation  PATIENT DISPOSITION:  PACU - hemodynamically stable.   Delay start of Pharmacological VTE agent (>24hrs) due to surgical blood loss or risk of bleeding: yes

## 2020-10-06 NOTE — Anesthesia Postprocedure Evaluation (Signed)
Anesthesia Post Note  Patient: LACHANDA BUCZEK  Procedure(s) Performed: Microlumbar decompression Lumbar five through Sacral one left and excision of synovial cyst (Left Spine Lumbar)     Patient location during evaluation: PACU Anesthesia Type: General Level of consciousness: awake and alert Pain management: pain level controlled Vital Signs Assessment: post-procedure vital signs reviewed and stable Respiratory status: spontaneous breathing, nonlabored ventilation, respiratory function stable and patient connected to nasal cannula oxygen Cardiovascular status: blood pressure returned to baseline and stable Postop Assessment: no apparent nausea or vomiting Anesthetic complications: no   No complications documented.  Last Vitals:  Vitals:   10/06/20 1039 10/06/20 1601  BP: (!) 164/59 (!) 159/54  Pulse: 77 80  Resp: 17 11  Temp: 36.8 C 36.7 C  SpO2: 98% 100%    Last Pain:  Vitals:   10/06/20 1601  TempSrc:   PainSc: 0-No pain                 Taliana Mersereau S

## 2020-10-06 NOTE — Consult Note (Signed)
Medical Consultation   Casey Watkins  EGB:[id number]  DOB: Nov 14, 1945  DOA: 10/06/2020  PCP: Nicoletta Dress, MD   Outpatient Specialists: [facility] - cardiology   Requesting physician: Tonita Cong - orthopedics  Reason for consultation: Spinal decompression surgery.  Tried to optimize glucose prior to surgery but this isn't going well.  Needs additional DM recommendations.  At minimum, will be hospitalized for an additional day.    History of Present Illness: Casey Watkins is an 75 y.o. female with medical history significant of HTN; DM; NASH; and HLD presenting for microlumbar decompression at L5-S1 and excision of L synovial cyst.  I saw the patient in PACU and she had multiple complaints - neck pain, sore throat, itchy nose... She, however, did not answer questions about her diabetes control.  She did have movement of her B feet and was unable to report if her back pain is improved.   Review of Systems:  ROS As per HPI otherwise 10 point review of systems negative.    Past Medical History: Past Medical History:  Diagnosis Date  . APC (atrial premature contractions)   . Cataract    Left  . Chronic GERD   . Chronic insomnia   . Degeneration of lumbar intervertebral disc   . Facet arthritis of lumbar region   . History of kidney stones   . Hyperlipidemia   . Hypertension   . Hypopotassemia   . Leg cramps   . Menopausal state   . Non-alcoholic fatty liver disease   . Osteopenia of multiple sites   . Uncontrolled type 2 diabetes mellitus with peripheral neuropathy ([place])   . Vitamin D deficiency     Past Surgical History: Past Surgical History:  Procedure Laterality Date  . TOTAL KNEE ARTHROPLASTY       Allergies:   Allergies  Allergen Reactions  . Shellfish Allergy Shortness Of Breath    shrimp     Social History:  reports that she has never smoked. She has never used smokeless tobacco. She reports previous alcohol use. She reports that she does  not use drugs.   Family History: Family History  Problem Relation Age of Onset  . CAD Mother   . Dementia Mother   . Heart disease Mother   . Diabetes Father   . Hypertension Father   . Hypertension Sister   . Hypertension Brother   . Lung cancer Brother       Physical Exam: Vitals:   10/06/20 1645 10/06/20 1700 10/06/20 1715 10/06/20 1743  BP: (!) 160/65 (!) 157/67 (!) 164/65 (!) 146/59  Pulse: 91 79 88 86  Resp: 19 15 12 18   Temp:   97.9 F (36.6 C) 97.7 F (36.5 C)  TempSrc:    Oral  SpO2: 92% 92% 92% 96%  Weight:      Height:        Constitutional: Alert and awake, oriented x3, not in any acute distress. Eyes:  EOMI, irises appear normal, anicteric sclera  ENMT: external ears and nose appear normal, normal hearing, Lips appear normal, oropharynx mucosa appear normal; edentulous Neck: neck appears normal, no masses, normal ROM CVS: S1-S2 clear, no murmur rubs or gallops, no LE edema, normal pedal pulses  Respiratory:  clear to auscultation bilaterally, no wheezing, rales or rhonchi. Respiratory effort normal. No accessory muscle use.  Abdomen: soft nontender, nondistended Musculoskeletal: : no cyanosis, clubbing or edema noted bilaterally  Psych: blunted mood and affect, mental status Skin: no rashes or lesions or ulcers on limited exam   Data reviewed:  I have personally reviewed the recent labs and imaging studies  Pertinent Labs:   Glucose 232, 226, 209 A1c 9.6 on 3/9 COVID negative on 3/9   Inpatient Medications:   Scheduled Meds: . acetaminophen  1,000 mg Oral Q6H  . [START ON 10/07/2020] amLODipine  5 mg Oral Daily  . docusate sodium  100 mg Oral BID  . famotidine  20 mg Oral BID  . furosemide  40 mg Oral Daily  . HYDROmorphone      . insulin aspart      . [START ON 10/07/2020] insulin aspart  0-15 Units Subcutaneous TID WC  . insulin detemir  60 Units Subcutaneous QHS  . insulin lispro  15 Units Subcutaneous TID with meals  . [START ON  10/07/2020] lisinopril  40 mg Oral Daily  . ondansetron      . pantoprazole  40 mg Oral Daily  . [START ON 10/09/2020] pravastatin  20 mg Oral Once per day on Mon Thu  . prednisoLONE acetate  1 drop Left Eye QHS  . RA Calcium 600/Vit D/Minerals  1 tablet Oral Daily  . triamcinolone cream   Topical Q2000   Continuous Infusions: . 0.9 % NaCl with KCl 20 mEq / L    .  ceFAZolin (ANCEF) IV    . methocarbamol (ROBAXIN) IV       Radiological Exams on Admission: No results found.  Impression/Recommendations Principal Problem:   Spinal stenosis of lumbar region Active Problems:   Essential hypertension   Mixed dyslipidemia   Diabetes mellitus type 2 in obese (HCC)   Class 1 obesity due to excess calories with body mass index (BMI) of 33.0 to 33.9 in adult  Spinal stenosis -Patient had decompression surgery and synovial cyst excision today -Admitted to orthopedics services -management per ortho  Uncontrolled DM -A1c is 9.6, indicating very poor control -Continue Levemir, consider transitioning to split doses BID  -Continue mealtime Humalog -Cover with resistant-scale SSI including hs coverage -Diabetes coordinator consulted  HTN -Continue Norvasc, Lisinopril, Lasix  HLD -Continue Pravachol  Class 1 Obesity -Body mass index is 33.2 kg/m..  -Weight loss should be encouraged -Outpatient PCP/bariatric medicine f/u encouraged    Thank you for this consultation.  Our [facility] hospitalist team will follow the patient with you.   Time Spent: 40 minutes  Karmen Bongo M.D. [facility] 10/06/2020, 5:58 PM

## 2020-10-06 NOTE — Interval H&P Note (Signed)
History and Physical Interval Note:  10/06/2020 12:26 PM  Casey Watkins  has presented today for surgery, with the diagnosis of Stenosis synovial cyst L5-S1 left.  The various methods of treatment have been discussed with the patient and family. After consideration of risks, benefits and other options for treatment, the patient has consented to  Procedure(s) with comments: Microlumbar decompression L5-S1 left and excision of synovial cyst (Left) - 2.5 hrs as a surgical intervention.  The patient's history has been reviewed, patient examined, no change in status, stable for surgery.  I have reviewed the patient's chart and labs.  Questions were answered to the patient's satisfaction.     Johnn Hai  Patient is positive neurotension signs.  Decreased plantar flexion.  EHL weakness on the left.  She is gotten dramatically worse in terms of pain.  She has seen her medical physician in the clearance for surgery.  And as of yesterday she consulted with her medical physician who is changed her blood glucose levels with a change in her medications. We discussed the operation in detail.  Risk and benefits.  We also discussed the absolute need to control her blood glucose levels.  Under 180.  Otherwise increase risk of infection delayed wound healing etc.  She understands would like to proceed with the procedure.  She also thinks the pain is elevating her blood glucose levels.  We will have her do monitored tightly postoperatively for glucose control.  And then at home she is arranged to consult with her medical physician for postoperative consultation and monitoring.

## 2020-10-06 NOTE — Transfer of Care (Signed)
Immediate Anesthesia Transfer of Care Note  Patient: Casey Watkins  Procedure(s) Performed: Microlumbar decompression Lumbar five through Sacral one left and excision of synovial cyst (Left Spine Lumbar)  Patient Location: PACU  Anesthesia Type:General  Level of Consciousness: awake, alert  and oriented  Airway & Oxygen Therapy: Patient Spontanous Breathing and Patient connected to face mask oxygen  Post-op Assessment: Report given to RN, Post -op Vital signs reviewed and stable and Patient moving all extremities X 4  Post vital signs: Reviewed and stable  Last Vitals:  Vitals Value Taken Time  BP 159/54 10/06/20 1600  Temp    Pulse 77 10/06/20 1601  Resp 14 10/06/20 1601  SpO2 100 % 10/06/20 1601  Vitals shown include unvalidated device data.  Last Pain:  Vitals:   10/06/20 1107  TempSrc:   PainSc: 10-Worst pain ever      Patients Stated Pain Goal: 2 (44/03/47 4259)  Complications: No complications documented.

## 2020-10-07 ENCOUNTER — Encounter (HOSPITAL_COMMUNITY): Payer: Self-pay | Admitting: Specialist

## 2020-10-07 DIAGNOSIS — M4807 Spinal stenosis, lumbosacral region: Secondary | ICD-10-CM | POA: Diagnosis not present

## 2020-10-07 DIAGNOSIS — M48062 Spinal stenosis, lumbar region with neurogenic claudication: Secondary | ICD-10-CM

## 2020-10-07 DIAGNOSIS — M7138 Other bursal cyst, other site: Secondary | ICD-10-CM | POA: Diagnosis not present

## 2020-10-07 DIAGNOSIS — M4727 Other spondylosis with radiculopathy, lumbosacral region: Secondary | ICD-10-CM | POA: Diagnosis not present

## 2020-10-07 DIAGNOSIS — Z91013 Allergy to seafood: Secondary | ICD-10-CM | POA: Diagnosis not present

## 2020-10-07 DIAGNOSIS — Z794 Long term (current) use of insulin: Secondary | ICD-10-CM | POA: Diagnosis not present

## 2020-10-07 DIAGNOSIS — E1142 Type 2 diabetes mellitus with diabetic polyneuropathy: Secondary | ICD-10-CM | POA: Diagnosis not present

## 2020-10-07 DIAGNOSIS — Z8614 Personal history of Methicillin resistant Staphylococcus aureus infection: Secondary | ICD-10-CM | POA: Diagnosis not present

## 2020-10-07 LAB — GLUCOSE, CAPILLARY: Glucose-Capillary: 143 mg/dL — ABNORMAL HIGH (ref 70–99)

## 2020-10-07 NOTE — Progress Notes (Signed)
Casey Watkins  MRN: [id number] DOB/Age: 1946/04/21 75 y.o. Physician: Ander Slade, M.D. 1 Day Post-Op Procedure(s) (LRB): Microlumbar decompression Lumbar five through Sacral one left and excision of synovial cyst (Left)  Subjective: Sitting comfortably in bedside chair, dressed and anxious to go home.  Patient has ambulated in the hallway with therapy.  Describes some mild to moderate low back pain but overall comfortable Vital Signs Temp:  [97.7 F (36.5 C)-98.3 F (36.8 C)] 98.1 F (36.7 C) (03/12 0802) Pulse Rate:  [73-91] 87 (03/12 0802) Resp:  [9-21] 18 (03/12 0802) BP: (132-164)/(54-78) 146/76 (03/12 0802) SpO2:  [92 %-100 %] 96 % (03/12 0802) Weight:  [77.1 kg] 77.1 kg (03/11 1039)  Lab Results Recent Labs    10/04/20 0945  WBC 9.2  HGB 14.5  HCT 43.3  PLT 241   BMET Recent Labs    10/04/20 0945  NA 139  K 3.7  CL 103  CO2 25  GLUCOSE 223*  BUN 25*  CREATININE 0.94  CALCIUM 9.6   No results found for: INR   Exam  Patient alert and oriented.  Grossly nonfocal neurologic exam.  Ambulates safely and cleared by PT for discharge  Plan Discharge to home with postoperative orders as per Dr. Tonita Cong.  Follow-up as scheduled Denim Kalmbach M Lillis Nuttle 10/07/2020, 8:55 AM   Contact # [phone]

## 2020-10-07 NOTE — Progress Notes (Addendum)
CONSULT PROGRESS NOTE  Casey Watkins PNT:[id number] DOB: [birth year] DOA: 10/06/2020 PCP: Nicoletta Dress, MD  HPI/Recap of past 24 hours: PCP: Nicoletta Dress, MD   Outpatient Specialists: [facility] - cardiology   Requesting physician: Tonita Cong - orthopedics  Reason for consultation: Spinal decompression surgery.  Tried to optimize glucose prior to surgery but wasn't going well.  Needs additional DM recommendations.  At minimum, will be hospitalized for an additional day.    History of Present Illness: Casey Watkins is an 75 y.o. female with medical history significant of HTN; DM2; NASH; and HLD presenting for microlumbar decompression at L5-S1 and excision of L synovial cyst.   Her CBGs have been uncontrolled in the 300s.  While in the hospital she was started on Levemir 60 units nightly, NovoLog 15 units 3 times daily, and sensitive insulin sliding scale which is not too different from her home regimen.  With this regimen CBG in the a.m. of 09/27/2020 was 143.  10/07/20: Patient was seen and examined with her husband present at bedside.  She denies having any abdominal pain or nausea.  She has been walking with PT and OT and tolerated well, no dizziness.  She is eager to go home.    States her blood sugars have been stable at home except for recently due to increasing pain.  States the pain is currently well controlled.  She has an upcoming appointment with her primary care provider who manages her diabetes in April 2022.  She has no other complaints at this time.  CBGs are currently stable in the 140s.    Assessment/Plan: Principal Problem:   Spinal stenosis of lumbar region Active Problems:   Essential hypertension   Mixed dyslipidemia   Diabetes mellitus type 2 in obese (HCC)   Class 1 obesity due to excess calories with body mass index (BMI) of 33.0 to 33.9 in adult   Type 2 diabetes with hyperglycemia likely exacerbated by increasing pain. Hemoglobin A1c 9.6 on  10/04/2020. Recommend to resume home regimen Levemir 60 units nightly, Humalog 15 unit in the morning, at noon, and at bedtime. Also resume home glipizide 5 mg daily. Spoke with the patient and recommended that she calls her primary care provider on Monday, 10/09/2020 for earlier appointment, next scheduled appointment is in April 2022. Repeat BMP on 10/13/2020 at your PCPs office.  Thank you for allowing [place] to participate in the care of this patient.  We will sign off.     Objective: Vitals:   10/06/20 1954 10/07/20 0007 10/07/20 0435 10/07/20 0802  BP: (!) 141/54 132/63 (!) 150/78 (!) 146/76  Pulse: 83 74 79 87  Resp: 20 20 18 18   Temp: 97.9 F (36.6 C) 98 F (36.7 C)  98.1 F (36.7 C)  TempSrc: Oral Oral Oral Oral  SpO2: 100% 97% 96% 96%  Weight:      Height:        Intake/Output Summary (Last 24 hours) at 10/07/2020 0947 Last data filed at 10/06/2020 2217 Gross per 24 hour  Intake 1350 ml  Output 1500 ml  Net -150 ml   Filed Weights   10/06/20 1039  Weight: 77.1 kg    Exam:  . General: 75 y.o. year-old female well developed well nourished in no acute distress.  Alert and oriented x3. . Cardiovascular: Regular rate and rhythm with no rubs or gallops.  No thyromegaly or JVD noted.   Marland Kitchen Respiratory: Clear to auscultation with no wheezes or rales. Good inspiratory  effort. . Abdomen: Soft nontender nondistended with normal bowel sounds x4 quadrants. . Musculoskeletal: No lower extremity edema.  Marland Kitchen Psychiatry: Mood is appropriate for condition and setting   Data Reviewed: CBC: Recent Labs  Lab 10/04/20 0945  WBC 9.2  HGB 14.5  HCT 43.3  MCV 93.7  PLT 559   Basic Metabolic Panel: Recent Labs  Lab 10/04/20 0945  NA 139  K 3.7  CL 103  CO2 25  GLUCOSE 223*  BUN 25*  CREATININE 0.94  CALCIUM 9.6   GFR: Estimated Creatinine Clearance: 46.7 mL/min (by C-G formula based on SCr of 0.94 mg/dL). Liver Function Tests: No results for input(s): AST, ALT, ALKPHOS,  BILITOT, PROT, ALBUMIN in the last 168 hours. No results for input(s): LIPASE, AMYLASE in the last 168 hours. No results for input(s): AMMONIA in the last 168 hours. Coagulation Profile: No results for input(s): INR, PROTIME in the last 168 hours. Cardiac Enzymes: No results for input(s): CKTOTAL, CKMB, CKMBINDEX, TROPONINI in the last 168 hours. BNP (last 3 results) No results for input(s): PROBNP in the last 8760 hours. HbA1C: No results for input(s): HGBA1C in the last 72 hours. CBG: Recent Labs  Lab 10/06/20 1343 10/06/20 1601 10/06/20 1932 10/06/20 2153 10/07/20 0637  GLUCAP 209* 145* 254* 317* 143*   Lipid Profile: No results for input(s): CHOL, HDL, LDLCALC, TRIG, CHOLHDL, LDLDIRECT in the last 72 hours. Thyroid Function Tests: No results for input(s): TSH, T4TOTAL, FREET4, T3FREE, THYROIDAB in the last 72 hours. Anemia Panel: No results for input(s): VITAMINB12, FOLATE, FERRITIN, TIBC, IRON, RETICCTPCT in the last 72 hours. Urine analysis: No results found for: COLORURINE, APPEARANCEUR, LABSPEC, PHURINE, GLUCOSEU, HGBUR, BILIRUBINUR, KETONESUR, PROTEINUR, UROBILINOGEN, NITRITE, LEUKOCYTESUR Sepsis Labs: @LABRCNTIP (procalcitonin:4,lacticidven:4)  ) Recent Results (from the past 240 hour(s))  Surgical pcr screen     Status: None   Collection Time: 10/04/20  9:46 AM   Specimen: Nasal Mucosa; Nasal Swab  Result Value Ref Range Status   MRSA, PCR NEGATIVE NEGATIVE Final   Staphylococcus aureus NEGATIVE NEGATIVE Final    Comment: (NOTE) The Xpert SA Assay (FDA approved for NASAL specimens in patients 58 years of age and older), is one component of a comprehensive surveillance program. It is not intended to diagnose infection nor to guide or monitor treatment. Performed at [facility], [place] [address] [place], [place] [address]   SARS CORONAVIRUS 2 (TAT 6-24 HRS) Nasopharyngeal Nasopharyngeal Swab     Status: None   Collection Time: 10/04/20 11:03 AM    Specimen: Nasopharyngeal Swab  Result Value Ref Range Status   SARS Coronavirus 2 NEGATIVE NEGATIVE Final    Comment: (NOTE) SARS-CoV-2 target nucleic acids are NOT DETECTED.  The SARS-CoV-2 RNA is generally detectable in upper and lower respiratory specimens during the acute phase of infection. Negative results do not preclude SARS-CoV-2 infection, do not rule out co-infections with other pathogens, and should not be used as the sole basis for treatment or other patient management decisions. Negative results must be combined with clinical observations, patient history, and epidemiological information. The expected result is Negative.  Fact Sheet for Patients: SugarRoll.be  Fact Sheet for Healthcare Providers: https://www.woods-mathews.com/  This test is not yet approved or cleared by the [place] FDA and  has been authorized for detection and/or diagnosis of SARS-CoV-2 by FDA under an Emergency Use Authorization (EUA). This EUA will remain  in effect (meaning this test can be used) for the duration of the COVID-19 declaration under Se ction 564(b)(1) of  the Act, 21 U.S.C. section 360bbb-3(b)(1), unless the authorization is terminated or revoked sooner.  Performed at [facility], [place] [address] [place], [place] [address]       Studies: DG Lumbar Spine 2-3 Views  Result Date: 10/06/2020 CLINICAL DATA:  Intraoperative localization for spine surgery. EXAM: LUMBAR SPINE - 2-3 VIEW COMPARISON:  Radiographs 10/04/2020 FINDINGS: The first film demonstrates spinal needles posteriorly marking the L5-S1 and S1-2 levels. The second film demonstrates surgical retractors and a surgical insert marking the L5-S1 disc space. The third film demonstrates surgical retractors and surgical instruments marking the L5-S1 disc space. IMPRESSION: Intraoperative localization of L5-S1. Electronically Signed   By: Marijo Sanes M.D.   On: 10/06/2020  18:41    Scheduled Meds: . acetaminophen  1,000 mg Oral Q6H  . amLODipine  5 mg Oral Daily  . calcium-vitamin D  1 tablet Oral Q breakfast  . docusate sodium  100 mg Oral BID  . famotidine  20 mg Oral BID  . furosemide  40 mg Oral Daily  . insulin aspart  0-20 Units Subcutaneous TID WC  . insulin aspart  0-5 Units Subcutaneous QHS  . insulin aspart  15 Units Subcutaneous TID WC  . insulin detemir  60 Units Subcutaneous QHS  . lisinopril  40 mg Oral Daily  . pantoprazole  40 mg Oral Daily  . [START ON 10/09/2020] pravastatin  20 mg Oral Once per day on Mon Thu  . prednisoLONE acetate  1 drop Left Eye QHS  . triamcinolone cream   Topical Q2000    Continuous Infusions: . 0.9 % NaCl with KCl 20 mEq / L    . methocarbamol (ROBAXIN) IV       LOS: 0 days     Kayleen Memos, MD [facility] Pager [phone]  If 7PM-7AM, please contact night-coverage [email or web address] Password [facility] 10/07/2020, 9:47 AM

## 2020-10-07 NOTE — Op Note (Signed)
[name], APT MEDICAL RECORD NO: [id number] ACCOUNT NO: [id number] DATE OF BIRTH: 1945-09-16 FACILITY: [facility] LOCATION: [facility] PHYSICIAN: Johnn Hai, MD  Operative Report   DATE OF PROCEDURE: 10/06/2020  PREOPERATIVE DIAGNOSIS:  Spinal stenosis, synovial cyst, L5-S1.  POSTOPERATIVE DIAGNOSIS:  Spinal stenosis, synovial cyst, L5-S1.  PROCEDURE PERFORMED:  Microlumbar decompression centrally at L4-L5.  Bilateral hemilaminotomies of L4.  Hemilaminotomy L5 on the left.  Foraminotomy L5-S1 on the left, excision of synovial cyst, left L5-S1.  ANESTHESIA:  General.  ASSISTANT: Lacie Draft, PA  SPECIMEN TO PATHOLOGY:  L5-S1 cyst.  HISTORY:  A 75 year old with severe left lower extremity radicular pain, L5 and S1 nerve root distribution.  The patient had been refractory to conservative treatment.  Had increasing pain in dorsiflexion and plantarflexion, with neural tension signs.   She had a large synovial cyst and a facet hypertrophy, displacing the L5 and S1 nerve roots.  She is indicated for microlumbar decompression, excision of synovial cyst.  Risks and benefits were discussed including bleeding, infection, damage to  neurovascular structures, no change in symptoms, worsening symptoms, DVT, PE, anesthetic complications, etc.  Preoperatively, we optimized her blood glucose as best possible with her medical physician who provided a clearance.  Prior to hospitalization, we made adjustments to her insulin.  On day of presentation, she had an elevated blood glucose level, was  given additional 6 units of insulin.  I discussed this with the patient indicating the need for perioperative glucose control to reduce the risk of infection and promote wound healing.  TECHNIQUE:  With the patient in supine position, after induction of adequate general anesthesia, 2 grams Kefzol, Foley to gravity.  She was placed prone on the Wilson frame.  All bony prominences were well padded.  Lumbar region  was prepped and draped in  the usual sterile fashion.  Two 18-gauge spinal needle was utilized to localize 5-1 interspace, confirmed with x-ray.  The patient had a significant scoliosis.  Incision was made from the spinous process of L5 to below S1.  Subcutaneous tissue was  dissected, electrocautery was utilized to achieve hemostasis.  The dorsal lumbar fascia identified in line with skin incision.  Paraspinous muscle elevated from L5 and S1.  McCulloch retractor was placed.  Confirmatory radiograph obtained with a Kocher  on the spinous process of L5.  I removed the spinous process of L5 with a Leksell rongeur.  I then performed a hemilaminotomy using the caudad edge of L5 bilaterally continuing cephalad preserving the pars to the point detaching the ligamentum flavum.  I  used a Woodson retractor to develop a plane between the thecal sac and the lamina prior to the hemilaminotomies.  In addition, I used a micro curette to detach ligamentum flavum from the cephalad edge of S1 on the left.  I then identified the facet of  L5-S1.  Skeletonized the facet medially.  I identified the foramen of S1.  Removed ligamentum flavum from the interspace.  I performed a foraminotomy of S1.  I then used a Woodson retractor to protect the nerve root.  I identified the superior  articulating process of S1 and decompressed the lateral recess of the medial border of the pedicle. Within the facet joint and extending medially is a large synovial cyst.  Gelatinous fluid was expressed from the cyst and it continued caudad, continued  cephalad.  After the hemilaminotomy of the caudad edge of L5 was then performed, we detached the ligamentum flavum.  We were able to get  above where the cyst was, the cyst extended to beneath the lamina of L5 and down inferiorly over the foramen of L5.   With her scoliosis she was compressed on the left disk side.  Next, I removed a portion of the cyst that was extending from the facet, separating it  from the cavity and the medial aspect of the cyst, which was adhered to the thecal sac.  There is no  plane to develop between the medial portion of the cyst and the thecal sac.  I performed a foraminotomy of L5 was fairly stenotic, preserving and protecting the L5 nerve root.  I extended cephalad to that as well.  We removed gelatinous material from the  interstices of the cyst.  There was a portion of the medial aspect of the cyst that was not adherent to the thecal sac and this was removed gently.  The adhesed portion was left in situ as I felt that attempted removal could not be performed without a  durotomy.  This significantly decompressed the thecal sac.  The cyst extended to the midline.  I removed ligamentum flavum from the central portion of the space as well.  Following this, there was a Woodson probe passed freely out the foramen of S1 and  out the foramen of L5.  We obtained confirmatory radiographs with the retractors in the foramen and above the pedicle of L5.  We had removed the contents of the cyst with a nerve hook and a micropituitary.  This allowed restoration of the thecal sac  laterally.  We copiously irrigated the wound.  Bone wax was placed on all cancellous surfaces.  Bipolar cautery was utilized to achieve hemostasis.  A small thrombin-soaked Gelfoam was placed in the foramen on the left, 2 x 2 mm and just beneath the  laminotomy centrally.  I then performed a Valsalva at 40 mm and there was no evidence of CSF leakage or active bleeding.  I felt we had decompressed the L5 and S1 nerve roots satisfactorily.  Following this, I copiously irrigated the wound.  No active  bleeding.  I removed the [facility] retractor.  No active bleeding.  Copiously irrigated paraspinous musculature, closed the dorsolumbar fascia with #1 Vicryl in interrupted figure-of-eight sutures, subcutaneous with 2-0 and skin with staples.  Wound was  dressed sterilely placed supine on the hospital bed, extubated  without difficulty and transported to the recovery room in satisfactory condition.  The patient tolerated the procedure well.  No complications.   Assistant Lacie Draft, [place] was used throughout the case for patient positioning, intermittent suction and gentle neural retraction.  Blood loss was 150 mL. The patient will have a postoperative consultation with hospitalist for blood glucose optimization.     SUJ D: 10/06/2020 4:09:29 pm T: 10/07/2020 6:21:00 am  JOB: [id number]/ [id number]

## 2020-10-07 NOTE — Evaluation (Signed)
Physical Therapy Evaluation & Discharge  Patient Details Name: Casey Watkins MRN: [id number] DOB: 04-01-1946 Today's Date: 10/07/2020   History of Present Illness  Pt is a 75 yr old female who s/p microlumbar decompression centrally at L4-L5,  Bilateral hemilaminotomies of L4, hemilaminotomy L5 on the left, foraminotomy L5-S1 on the left, excision of synovial cyst, left L5-S1. PMH:  HTN, DM, total knee arthroplasty  Clinical Impression  PTA, patient was independent with mobility and occasional use of SPC. Patient overall modI for mobility with no AD. Education provided on back precautions with good carryover during mobility. No further skilled PT needs required during acute stay. No PT follow up recommended at this time.     Follow Up Recommendations No PT follow up    Equipment Recommendations  None recommended by PT    Recommendations for Other Services       Precautions / Restrictions Precautions Precautions: Back Precaution Booklet Issued: Yes (comment) Precaution Comments: no brace needed per orders Restrictions Weight Bearing Restrictions: No      Mobility  Bed Mobility Overal bed mobility:  (presented OOB)             General bed mobility comments: Sitting in recliner on arrival    Transfers Overall transfer level: Modified independent Equipment used: None Transfers: Sit to/from Stand Sit to Stand: Supervision         General transfer comment: cues to decrease bending  Ambulation/Gait Ambulation/Gait assistance: Modified independent (Device/Increase time) Gait Distance (Feet): 200 Feet Assistive device: None Gait Pattern/deviations: WFL(Within Functional Limits)        Stairs Stairs: Yes Stairs assistance: Modified independent (Device/Increase time) Stair Management: One rail Left;Forwards Number of Stairs: 1    Wheelchair Mobility    Modified Rankin (Stroke Patients Only)       Balance Overall balance assessment: Needs  assistance Sitting-balance support: Feet supported Sitting balance-Leahy Scale: Good     Standing balance support: No upper extremity supported Standing balance-Leahy Scale: Fair                               Pertinent Vitals/Pain Pain Assessment: 0-10 Pain Score: 8  Pain Location: sx site Pain Descriptors / Indicators: Aching;Guarding;Sore Pain Intervention(s): Monitored during session    Home Living Family/patient expects to be discharged to:: Private residence Living Arrangements: Spouse/significant other Available Help at Discharge: Family Type of Home: House Home Access: Level entry;Other (comment) (one step in/out of kitchen)     Home Layout: One level Home Equipment: Cane - single point      Prior Function Level of Independence: Independent with assistive device(s)         Comments: sometimes using a cane     Hand Dominance   Dominant Hand: Right    Extremity/Trunk Assessment   Upper Extremity Assessment Upper Extremity Assessment: Defer to OT evaluation    Lower Extremity Assessment Lower Extremity Assessment: Overall WFL for tasks assessed    Cervical / Trunk Assessment Cervical / Trunk Assessment: Other exceptions;Kyphotic (post sx)  Communication   Communication: No difficulties  Cognition Arousal/Alertness: Awake/alert Behavior During Therapy: WFL for tasks assessed/performed Overall Cognitive Status: Within Functional Limits for tasks assessed                                        General Comments  Exercises     Assessment/Plan    PT Assessment Patent does not need any further PT services  PT Problem List         PT Treatment Interventions      PT Goals (Current goals can be found in the Care Plan section)  Acute Rehab PT Goals Patient Stated Goal: To have decrease pain PT Goal Formulation: With patient    Frequency     Barriers to discharge        Co-evaluation                AM-PAC PT "6 Clicks" Mobility  Outcome Measure Help needed turning from your back to your side while in a flat bed without using bedrails?: None Help needed moving from lying on your back to sitting on the side of a flat bed without using bedrails?: None Help needed moving to and from a bed to a chair (including a wheelchair)?: None Help needed standing up from a chair using your arms (e.g., wheelchair or bedside chair)?: None Help needed to walk in hospital room?: None Help needed climbing 3-5 steps with a railing? : None 6 Click Score: 24    End of Session   Activity Tolerance: Patient tolerated treatment well Patient left: in chair;with family/visitor present Nurse Communication: Mobility status PT Visit Diagnosis: Muscle weakness (generalized) (M62.81)    Time: 3568-6168 PT Time Calculation (min) (ACUTE ONLY): 11 min   Charges:   PT Evaluation $PT Eval Low Complexity: 1 Low          Pacen Watford A. Gilford Rile PT, DPT Acute Rehabilitation Services Pager [phone] Office [phone]   Linna Hoff 10/07/2020, 8:53 AM

## 2020-10-07 NOTE — Discharge Instructions (Signed)

## 2020-10-07 NOTE — Evaluation (Signed)
Occupational Therapy Evaluation Patient Details Name: Casey Watkins MRN: [id number] DOB: 01-18-1946 Today's Date: 10/07/2020    History of Present Illness Pt is a 75 yr old female who s/p microlumbar decompression centrally at L4-L5,  Bilateral hemilaminotomies of L4, hemilaminotomy L5 on the left, foraminotomy L5-S1 on the left, excision of synovial cyst, left L5-S1. PMH:  HTN, DM, total knee arthroplasty   Clinical Impression   Pt lives with their husband and will be able to assist 24/7 when returning home. Pt at this time educated on precautions and use of AE to complete ADLS. Pt at this time requires supervision to min guard for LE dressing. Pt completed ambulation at room level with min guard and cues on safety. Pt was educated on compensations to complete ADLS as due to fatigue.      Follow Up Recommendations  No OT follow up;Supervision - Intermittent    Equipment Recommendations       Recommendations for Other Services       Precautions / Restrictions Precautions Precautions: Back Precaution Booklet Issued: Yes (comment) Restrictions Weight Bearing Restrictions: No      Mobility Bed Mobility Overal bed mobility:  (presented OOB)                  Transfers Overall transfer level: Needs assistance Equipment used:  (cues for technique) Transfers: Sit to/from Stand Sit to Stand: Supervision         General transfer comment: cues to decrease bending    Balance Overall balance assessment: Needs assistance Sitting-balance support: Feet supported Sitting balance-Leahy Scale: Good                                     ADL either performed or assessed with clinical judgement   ADL Overall ADL's : Needs assistance/impaired Eating/Feeding: Independent;Sitting   Grooming: Wash/dry hands;Wash/dry face;Supervision/safety;Cueing for safety;Cueing for sequencing   Upper Body Bathing: Supervision/ safety;Cueing for safety;Cueing for sequencing    Lower Body Bathing: Min guard;Cueing for back precautions   Upper Body Dressing : Supervision/safety;Cueing for sequencing;Cueing for safety   Lower Body Dressing: Cueing for safety;Cueing for sequencing;Cueing for back precautions;Supervision/safety   Toilet Transfer: Supervision/safety;Cueing for safety;Cueing for sequencing   Toileting- Clothing Manipulation and Hygiene: Supervision/safety;Cueing for safety;Cueing for sequencing;Cueing for compensatory techniques   Tub/ Shower Transfer: Min guard;Cueing for safety;Cueing for sequencing   Functional mobility during ADLs: Min guard;Cueing for safety;Cueing for sequencing       Vision   Vision Assessment?: No apparent visual deficits     Perception     Praxis      Pertinent Vitals/Pain Pain Assessment: 0-10 Pain Score: 8  Pain Location: sx site Pain Descriptors / Indicators: Aching;Guarding Pain Intervention(s): Monitored during session;Repositioned     Hand Dominance Right   Extremity/Trunk Assessment Upper Extremity Assessment Upper Extremity Assessment: Overall WFL for tasks assessed   Lower Extremity Assessment Lower Extremity Assessment: Defer to PT evaluation   Cervical / Trunk Assessment Cervical / Trunk Assessment: Other exceptions (post sx)   Communication Communication Communication: No difficulties   Cognition Arousal/Alertness: Awake/alert Behavior During Therapy: WFL for tasks assessed/performed Overall Cognitive Status: Within Functional Limits for tasks assessed                                     General Comments  Exercises     Shoulder Instructions      Home Living Family/patient expects to be discharged to:: Private residence Living Arrangements: Spouse/significant other Available Help at Discharge: Family Type of Home: House Home Access: Level entry;Other (comment) (one step in/out of kitchen)     Home Layout: One level     Bathroom Shower/Tub: Emergency planning/management officer: Standard Bathroom Accessibility: Yes How Accessible: Accessible via walker Home Equipment: [place] - single point          Prior Functioning/Environment Level of Independence: Independent with assistive device(s)        Comments: sometimes using a cane        OT Problem List: Decreased strength;Decreased activity tolerance;Decreased safety awareness;Decreased knowledge of precautions;Pain      OT Treatment/Interventions:      OT Goals(Current goals can be found in the care plan section) Acute Rehab OT Goals Patient Stated Goal: To have decrease pain OT Goal Formulation: With patient Time For Goal Achievement: 10/14/20 Potential to Achieve Goals: Good  OT Frequency:     Barriers to D/C:            Co-evaluation              AM-PAC OT "6 Clicks" Daily Activity     Outcome Measure Help from another person eating meals?: None Help from another person taking care of personal grooming?: None Help from another person toileting, which includes using toliet, bedpan, or urinal?: A Little Help from another person bathing (including washing, rinsing, drying)?: A Little Help from another person to put on and taking off regular upper body clothing?: None Help from another person to put on and taking off regular lower body clothing?: A Little 6 Click Score: 21   End of Session Equipment Utilized During Treatment: Gait belt Nurse Communication: Mobility status  Activity Tolerance: Patient limited by fatigue Patient left: in bed;with call bell/phone within reach  OT Visit Diagnosis: Unsteadiness on feet (R26.81);Muscle weakness (generalized) (M62.81);Other abnormalities of gait and mobility (R26.89);Pain Pain - part of body:  (back)                Time: 6045-4098 OT Time Calculation (min): 26 min Charges:  OT General Charges $OT Visit: 1 Visit OT Evaluation $OT Eval Low Complexity: 1 Low OT Treatments $Self Care/Home Management : 8-22  mins  Joeseph Amor OTR/L  Acute Rehab Services  [phone] office number [phone] pager number   Joeseph Amor 10/07/2020, 8:17 AM

## 2020-10-07 NOTE — Progress Notes (Addendum)
Patient alert and oriented, voiding adequately, MAE well with no difficulty. Incision area cdi with no s/s of infection. Patient discharged home per order. Patient and husband stated understanding of discharge instructions and diabetes education given. Patient has an appointment with Dr.Beane in 2 weeks

## 2020-10-09 MED FILL — Thrombin (Recombinant) For Soln 20000 Unit: CUTANEOUS | Qty: 1 | Status: AC

## 2020-10-10 LAB — SURGICAL PATHOLOGY

## 2020-10-24 DIAGNOSIS — L821 Other seborrheic keratosis: Secondary | ICD-10-CM | POA: Diagnosis not present

## 2020-10-24 DIAGNOSIS — L578 Other skin changes due to chronic exposure to nonionizing radiation: Secondary | ICD-10-CM | POA: Diagnosis not present

## 2020-10-24 DIAGNOSIS — L570 Actinic keratosis: Secondary | ICD-10-CM | POA: Diagnosis not present

## 2020-10-27 DIAGNOSIS — J301 Allergic rhinitis due to pollen: Secondary | ICD-10-CM | POA: Diagnosis not present

## 2020-11-27 DIAGNOSIS — M5459 Other low back pain: Secondary | ICD-10-CM | POA: Diagnosis not present

## 2020-11-27 DIAGNOSIS — Z4889 Encounter for other specified surgical aftercare: Secondary | ICD-10-CM | POA: Diagnosis not present

## 2020-11-29 DIAGNOSIS — M5459 Other low back pain: Secondary | ICD-10-CM | POA: Diagnosis not present

## 2020-11-29 DIAGNOSIS — Z4889 Encounter for other specified surgical aftercare: Secondary | ICD-10-CM | POA: Diagnosis not present

## 2020-12-04 DIAGNOSIS — M5459 Other low back pain: Secondary | ICD-10-CM | POA: Diagnosis not present

## 2020-12-04 DIAGNOSIS — Z4889 Encounter for other specified surgical aftercare: Secondary | ICD-10-CM | POA: Diagnosis not present

## 2020-12-11 DIAGNOSIS — R600 Localized edema: Secondary | ICD-10-CM | POA: Diagnosis not present

## 2020-12-11 DIAGNOSIS — E785 Hyperlipidemia, unspecified: Secondary | ICD-10-CM | POA: Diagnosis not present

## 2020-12-11 DIAGNOSIS — E559 Vitamin D deficiency, unspecified: Secondary | ICD-10-CM | POA: Diagnosis not present

## 2020-12-11 DIAGNOSIS — M5459 Other low back pain: Secondary | ICD-10-CM | POA: Diagnosis not present

## 2020-12-11 DIAGNOSIS — Z1231 Encounter for screening mammogram for malignant neoplasm of breast: Secondary | ICD-10-CM | POA: Diagnosis not present

## 2020-12-11 DIAGNOSIS — I10 Essential (primary) hypertension: Secondary | ICD-10-CM | POA: Diagnosis not present

## 2020-12-11 DIAGNOSIS — Z4889 Encounter for other specified surgical aftercare: Secondary | ICD-10-CM | POA: Diagnosis not present

## 2020-12-11 DIAGNOSIS — Z7689 Persons encountering health services in other specified circumstances: Secondary | ICD-10-CM | POA: Diagnosis not present

## 2020-12-18 DIAGNOSIS — M5459 Other low back pain: Secondary | ICD-10-CM | POA: Diagnosis not present

## 2020-12-18 DIAGNOSIS — Z4889 Encounter for other specified surgical aftercare: Secondary | ICD-10-CM | POA: Diagnosis not present

## 2020-12-21 DIAGNOSIS — Z4889 Encounter for other specified surgical aftercare: Secondary | ICD-10-CM | POA: Diagnosis not present

## 2020-12-21 DIAGNOSIS — M5459 Other low back pain: Secondary | ICD-10-CM | POA: Diagnosis not present

## 2020-12-27 DIAGNOSIS — Z4889 Encounter for other specified surgical aftercare: Secondary | ICD-10-CM | POA: Diagnosis not present

## 2020-12-27 DIAGNOSIS — M5459 Other low back pain: Secondary | ICD-10-CM | POA: Diagnosis not present

## 2021-01-02 DIAGNOSIS — C44329 Squamous cell carcinoma of skin of other parts of face: Secondary | ICD-10-CM | POA: Diagnosis not present

## 2021-01-02 DIAGNOSIS — L570 Actinic keratosis: Secondary | ICD-10-CM | POA: Diagnosis not present

## 2021-01-02 DIAGNOSIS — L821 Other seborrheic keratosis: Secondary | ICD-10-CM | POA: Diagnosis not present

## 2021-01-25 DIAGNOSIS — E1142 Type 2 diabetes mellitus with diabetic polyneuropathy: Secondary | ICD-10-CM | POA: Diagnosis not present

## 2021-01-25 DIAGNOSIS — K219 Gastro-esophageal reflux disease without esophagitis: Secondary | ICD-10-CM | POA: Diagnosis not present

## 2021-01-25 DIAGNOSIS — M47816 Spondylosis without myelopathy or radiculopathy, lumbar region: Secondary | ICD-10-CM | POA: Diagnosis not present

## 2021-03-13 DIAGNOSIS — M2041 Other hammer toe(s) (acquired), right foot: Secondary | ICD-10-CM | POA: Diagnosis not present

## 2021-03-13 DIAGNOSIS — E1142 Type 2 diabetes mellitus with diabetic polyneuropathy: Secondary | ICD-10-CM | POA: Diagnosis not present

## 2021-03-13 DIAGNOSIS — E785 Hyperlipidemia, unspecified: Secondary | ICD-10-CM | POA: Diagnosis not present

## 2021-03-13 DIAGNOSIS — M2042 Other hammer toe(s) (acquired), left foot: Secondary | ICD-10-CM | POA: Diagnosis not present

## 2021-03-13 DIAGNOSIS — E559 Vitamin D deficiency, unspecified: Secondary | ICD-10-CM | POA: Diagnosis not present

## 2021-03-13 DIAGNOSIS — R600 Localized edema: Secondary | ICD-10-CM | POA: Diagnosis not present

## 2021-03-13 DIAGNOSIS — G4762 Sleep related leg cramps: Secondary | ICD-10-CM | POA: Diagnosis not present

## 2021-03-13 DIAGNOSIS — Z1231 Encounter for screening mammogram for malignant neoplasm of breast: Secondary | ICD-10-CM | POA: Diagnosis not present

## 2021-03-13 DIAGNOSIS — I10 Essential (primary) hypertension: Secondary | ICD-10-CM | POA: Diagnosis not present

## 2021-03-20 DIAGNOSIS — B351 Tinea unguium: Secondary | ICD-10-CM | POA: Diagnosis not present

## 2021-04-03 DIAGNOSIS — C44329 Squamous cell carcinoma of skin of other parts of face: Secondary | ICD-10-CM | POA: Diagnosis not present

## 2021-04-03 DIAGNOSIS — L570 Actinic keratosis: Secondary | ICD-10-CM | POA: Diagnosis not present

## 2021-04-03 DIAGNOSIS — L820 Inflamed seborrheic keratosis: Secondary | ICD-10-CM | POA: Diagnosis not present

## 2021-05-04 DIAGNOSIS — Z23 Encounter for immunization: Secondary | ICD-10-CM | POA: Diagnosis not present

## 2021-06-13 DIAGNOSIS — R600 Localized edema: Secondary | ICD-10-CM | POA: Diagnosis not present

## 2021-06-13 DIAGNOSIS — E785 Hyperlipidemia, unspecified: Secondary | ICD-10-CM | POA: Diagnosis not present

## 2021-06-13 DIAGNOSIS — E1142 Type 2 diabetes mellitus with diabetic polyneuropathy: Secondary | ICD-10-CM | POA: Diagnosis not present

## 2021-06-13 DIAGNOSIS — E559 Vitamin D deficiency, unspecified: Secondary | ICD-10-CM | POA: Diagnosis not present

## 2021-06-13 DIAGNOSIS — Z79899 Other long term (current) drug therapy: Secondary | ICD-10-CM | POA: Diagnosis not present

## 2021-06-13 DIAGNOSIS — K645 Perianal venous thrombosis: Secondary | ICD-10-CM | POA: Diagnosis not present

## 2021-06-13 DIAGNOSIS — I10 Essential (primary) hypertension: Secondary | ICD-10-CM | POA: Diagnosis not present

## 2021-06-26 DIAGNOSIS — H57813 Brow ptosis, bilateral: Secondary | ICD-10-CM | POA: Diagnosis not present

## 2021-06-26 DIAGNOSIS — E119 Type 2 diabetes mellitus without complications: Secondary | ICD-10-CM | POA: Insufficient documentation

## 2021-06-26 DIAGNOSIS — E7800 Pure hypercholesterolemia, unspecified: Secondary | ICD-10-CM | POA: Insufficient documentation

## 2021-06-26 DIAGNOSIS — H02831 Dermatochalasis of right upper eyelid: Secondary | ICD-10-CM | POA: Diagnosis not present

## 2021-06-26 DIAGNOSIS — H02413 Mechanical ptosis of bilateral eyelids: Secondary | ICD-10-CM | POA: Diagnosis not present

## 2021-06-26 DIAGNOSIS — H0279 Other degenerative disorders of eyelid and periocular area: Secondary | ICD-10-CM | POA: Diagnosis not present

## 2021-06-26 DIAGNOSIS — H02834 Dermatochalasis of left upper eyelid: Secondary | ICD-10-CM | POA: Diagnosis not present

## 2021-06-26 DIAGNOSIS — H53483 Generalized contraction of visual field, bilateral: Secondary | ICD-10-CM | POA: Diagnosis not present

## 2021-06-26 DIAGNOSIS — H02423 Myogenic ptosis of bilateral eyelids: Secondary | ICD-10-CM | POA: Diagnosis not present

## 2021-06-29 DIAGNOSIS — Z9181 History of falling: Secondary | ICD-10-CM | POA: Diagnosis not present

## 2021-06-29 DIAGNOSIS — E785 Hyperlipidemia, unspecified: Secondary | ICD-10-CM | POA: Diagnosis not present

## 2021-06-29 DIAGNOSIS — Z0000 Encounter for general adult medical examination without abnormal findings: Secondary | ICD-10-CM | POA: Diagnosis not present

## 2021-07-05 DIAGNOSIS — E11319 Type 2 diabetes mellitus with unspecified diabetic retinopathy without macular edema: Secondary | ICD-10-CM | POA: Diagnosis not present

## 2021-07-05 DIAGNOSIS — E1142 Type 2 diabetes mellitus with diabetic polyneuropathy: Secondary | ICD-10-CM | POA: Diagnosis not present

## 2021-07-11 DIAGNOSIS — H53483 Generalized contraction of visual field, bilateral: Secondary | ICD-10-CM | POA: Diagnosis not present

## 2021-09-13 DIAGNOSIS — E785 Hyperlipidemia, unspecified: Secondary | ICD-10-CM | POA: Diagnosis not present

## 2021-09-13 DIAGNOSIS — E1142 Type 2 diabetes mellitus with diabetic polyneuropathy: Secondary | ICD-10-CM | POA: Diagnosis not present

## 2021-09-13 DIAGNOSIS — M8589 Other specified disorders of bone density and structure, multiple sites: Secondary | ICD-10-CM | POA: Diagnosis not present

## 2021-09-13 DIAGNOSIS — I10 Essential (primary) hypertension: Secondary | ICD-10-CM | POA: Diagnosis not present

## 2021-09-13 DIAGNOSIS — E559 Vitamin D deficiency, unspecified: Secondary | ICD-10-CM | POA: Diagnosis not present

## 2021-09-13 DIAGNOSIS — Z1231 Encounter for screening mammogram for malignant neoplasm of breast: Secondary | ICD-10-CM | POA: Diagnosis not present

## 2021-09-13 DIAGNOSIS — R600 Localized edema: Secondary | ICD-10-CM | POA: Diagnosis not present

## 2021-09-24 DIAGNOSIS — E1142 Type 2 diabetes mellitus with diabetic polyneuropathy: Secondary | ICD-10-CM | POA: Diagnosis not present

## 2021-10-30 DIAGNOSIS — E11319 Type 2 diabetes mellitus with unspecified diabetic retinopathy without macular edema: Secondary | ICD-10-CM | POA: Diagnosis not present

## 2021-10-30 DIAGNOSIS — R7401 Elevation of levels of liver transaminase levels: Secondary | ICD-10-CM | POA: Diagnosis not present

## 2021-10-30 DIAGNOSIS — M8580 Other specified disorders of bone density and structure, unspecified site: Secondary | ICD-10-CM | POA: Diagnosis not present

## 2021-10-30 DIAGNOSIS — I491 Atrial premature depolarization: Secondary | ICD-10-CM | POA: Diagnosis not present

## 2021-10-30 DIAGNOSIS — E559 Vitamin D deficiency, unspecified: Secondary | ICD-10-CM | POA: Diagnosis not present

## 2021-10-30 DIAGNOSIS — E1140 Type 2 diabetes mellitus with diabetic neuropathy, unspecified: Secondary | ICD-10-CM | POA: Diagnosis not present

## 2021-10-30 DIAGNOSIS — E1165 Type 2 diabetes mellitus with hyperglycemia: Secondary | ICD-10-CM | POA: Diagnosis not present

## 2021-11-12 DIAGNOSIS — M4306 Spondylolysis, lumbar region: Secondary | ICD-10-CM | POA: Diagnosis not present

## 2021-11-12 DIAGNOSIS — M65231 Calcific tendinitis, right forearm: Secondary | ICD-10-CM | POA: Diagnosis not present

## 2021-11-15 DIAGNOSIS — E785 Hyperlipidemia, unspecified: Secondary | ICD-10-CM | POA: Diagnosis not present

## 2021-11-15 DIAGNOSIS — R079 Chest pain, unspecified: Secondary | ICD-10-CM | POA: Diagnosis not present

## 2021-11-15 DIAGNOSIS — K7469 Other cirrhosis of liver: Secondary | ICD-10-CM | POA: Diagnosis not present

## 2021-11-15 DIAGNOSIS — I4891 Unspecified atrial fibrillation: Secondary | ICD-10-CM | POA: Diagnosis not present

## 2021-11-15 DIAGNOSIS — I10 Essential (primary) hypertension: Secondary | ICD-10-CM | POA: Diagnosis not present

## 2021-11-20 ENCOUNTER — Encounter: Payer: Self-pay | Admitting: *Deleted

## 2021-11-20 ENCOUNTER — Encounter: Payer: Self-pay | Admitting: Cardiology

## 2021-11-21 DIAGNOSIS — M545 Low back pain, unspecified: Secondary | ICD-10-CM | POA: Diagnosis not present

## 2021-11-30 DIAGNOSIS — M5136 Other intervertebral disc degeneration, lumbar region: Secondary | ICD-10-CM | POA: Diagnosis not present

## 2021-11-30 DIAGNOSIS — M545 Low back pain, unspecified: Secondary | ICD-10-CM | POA: Diagnosis not present

## 2021-12-04 ENCOUNTER — Ambulatory Visit: Payer: Medicare Other | Admitting: Cardiology

## 2021-12-04 ENCOUNTER — Ambulatory Visit (INDEPENDENT_AMBULATORY_CARE_PROVIDER_SITE_OTHER): Payer: Medicare Other

## 2021-12-04 ENCOUNTER — Encounter: Payer: Self-pay | Admitting: Cardiology

## 2021-12-04 VITALS — BP 138/68 | HR 65 | Ht 60.0 in | Wt 165.8 lb

## 2021-12-04 DIAGNOSIS — E669 Obesity, unspecified: Secondary | ICD-10-CM

## 2021-12-04 DIAGNOSIS — R0609 Other forms of dyspnea: Secondary | ICD-10-CM | POA: Diagnosis not present

## 2021-12-04 DIAGNOSIS — I10 Essential (primary) hypertension: Secondary | ICD-10-CM | POA: Diagnosis not present

## 2021-12-04 DIAGNOSIS — I480 Paroxysmal atrial fibrillation: Secondary | ICD-10-CM | POA: Diagnosis not present

## 2021-12-04 DIAGNOSIS — R079 Chest pain, unspecified: Secondary | ICD-10-CM

## 2021-12-04 DIAGNOSIS — E782 Mixed hyperlipidemia: Secondary | ICD-10-CM

## 2021-12-04 DIAGNOSIS — E1169 Type 2 diabetes mellitus with other specified complication: Secondary | ICD-10-CM

## 2021-12-04 HISTORY — DX: Paroxysmal atrial fibrillation: I48.0

## 2021-12-04 MED ORDER — ROSUVASTATIN CALCIUM 10 MG PO TABS: 10.0000 mg | ORAL_TABLET | Freq: Every day | ORAL | 0 refills | Status: DC

## 2021-12-04 NOTE — Addendum Note (Signed)
Addended by: Jacobo Forest D on: 12/04/2021 09:37 AM ? ? Modules accepted: Orders ? ?

## 2021-12-04 NOTE — Progress Notes (Signed)
?Cardiology Office Note:   ? ?Date:  12/04/2021  ? ?ID:  Casey Watkins, DOB Feb 03, 1946, MRN [id number] ? ?PCP:  Nicoletta Dress, MD  ?Cardiologist:  Jenne Campus, MD   ? ?Referring MD: Nicoletta Dress, MD  ? ?Chief Complaint  ?Patient presents with  ? Atrial Fibrillation  ? ? ?History of Present Illness:   ? ?Casey Watkins is a 76 y.o. female with past medical history significant for essential hypertension, diabetes, dyslipidemia.  She comes to my office for follow-up recently she was discovered to have atrial fibrillation fairly controlled ventricular rate.  Her primary care physician very appropriately put her on Eliquis and she is doing well.  She described to have palpitations on and off for years but lately became more frequent.  When she gets palpitations she feels some sensation of the chest interesting when she pressed chest wall seems to be getting worse.  She denies having any dizziness or passing out there is no shortness of breath when she get palpitations. ? ?Past Medical History:  ?Diagnosis Date  ? Acquired hammertoes of both feet 04/11/2020  ? APC (atrial premature contractions)   ? Atrial fibrillation, controlled ([place])   ? Cardiac murmur 02/02/2020  ? Cataract   ? Left  ? Chest pain 02/02/2020  ? Chronic GERD   ? Chronic insomnia   ? Cirrhosis of liver ([place])   ? Class 1 obesity due to excess calories with body mass index (BMI) of 33.0 to 33.9 in adult 10/06/2020  ? Degeneration of lumbar intervertebral disc   ? Diabetes mellitus type 2 in obese ([place]) 02/02/2020  ? Essential hypertension 02/02/2020  ? Facet arthritis of lumbar region   ? History of kidney stones   ? Hyperlipidemia   ? Hypertension   ? Hypopotassemia   ? Leg cramps   ? Menopausal state   ? Mixed dyslipidemia 02/02/2020  ? Non-alcoholic fatty liver disease   ? Onychomycosis due to dermatophyte 04/11/2020  ? Osteopenia of multiple sites   ? Other acquired deformity of toe 04/11/2020  ? Pain in joint of right hip 09/09/2019  ?  Palpitations 02/02/2020  ? Spinal stenosis of lumbar region 10/06/2020  ? Spinal stenosis, lumbar 10/06/2020  ? Uncontrolled type 2 diabetes mellitus with peripheral neuropathy   ? Vitamin D deficiency   ? ? ?Past Surgical History:  ?Procedure Laterality Date  ? APPENDECTOMY  1977  ? BLADDER SURGERY  06/26/2017  ? Bladder tack up by Dr. Rosario Adie at [facility]  ? CARPAL TUNNEL RELEASE Right 2005  ? CATARACT EXTRACTION Bilateral 08/2018  ? Dr. Elodia Florence  ? LUMBAR LAMINECTOMY/DECOMPRESSION MICRODISCECTOMY Left 10/06/2020  ? Procedure: Microlumbar decompression Lumbar five through Sacral one left and excision of synovial cyst;  Surgeon: Susa Day, MD;  Location: [facility];  Service: Orthopedics;  Laterality: Left;  2.5 hrs  ? MEDIAL PARTIAL KNEE REPLACEMENT Right 2006  ? MEDIAL PARTIAL KNEE REPLACEMENT Left 2000  ? REPLACEMENT TOTAL KNEE Right 2012  ? TOTAL KNEE ARTHROPLASTY Left 2008  ? TUBAL LIGATION  1977  ? ? ?Current Medications: ?Current Meds  ?Medication Sig  ? alendronate (FOSAMAX) 70 MG tablet Take 70 mg by mouth once a week.  ? amLODipine (NORVASC) 5 MG tablet Take 5 mg by mouth daily.  ? aspirin EC 81 MG tablet Take 81 mg by mouth daily. Swallow whole.  ? betamethasone dipropionate 0.05 % cream Apply 1 application. topically 2 (two) times daily.  ? Calcium Carbonate-Vit D-Min (  RA CALCIUM 600/VIT D/MINERALS) 600-200 MG-UNIT TABS Take 1 tablet by mouth in the morning and at bedtime.  ? cetirizine (ZYRTEC) 10 MG tablet Take 10 mg by mouth at bedtime.  ? Cholecalciferol (VITAMIN D3) 25 MCG (1000 UT) CAPS Take 1,000 Units by mouth daily.  ? diclofenac (VOLTAREN) 75 MG EC tablet Take 75 mg by mouth 2 (two) times daily.  ? ELIQUIS 5 MG TABS tablet Take 5 mg by mouth 2 (two) times daily.  ? famotidine (PEPCID) 20 MG tablet Take 20 mg by mouth 2 (two) times daily.  ? furosemide (LASIX) 40 MG tablet Take 40 mg by mouth daily.  ? gabapentin (NEURONTIN) 300 MG capsule Take 300 mg by mouth 2 (two) times daily.  ?  glipiZIDE (GLUCOTROL) 5 MG tablet Take 5 mg by mouth daily.  ? glucosamine-chondroitin 500-400 MG tablet Take 1 tablet by mouth 2 (two) times daily.  ? hydrocortisone (ANUSOL-HC) 2.5 % rectal cream Apply 1 application. topically 3 (three) times daily as needed for hemorrhoids.  ? ibuprofen (ADVIL) 800 MG tablet Take 800 mg by mouth 3 (three) times daily as needed for pain.  ? insulin detemir (LEVEMIR FLEXTOUCH) 100 UNIT/ML FlexPen Inject 76 Units into the skin at bedtime.  ? insulin lispro (HUMALOG KWIKPEN) 200 UNIT/ML KwikPen Inject 15 Units into the skin in the morning, at noon, and at bedtime.  ? lisinopril (ZESTRIL) 40 MG tablet Take 40 mg by mouth daily.  ? Multiple Vitamin (MULTIVITAMIN) tablet Take 1 tablet by mouth daily.  ? omeprazole (PRILOSEC) 40 MG capsule Take 40 mg by mouth daily.  ? pramipexole (MIRAPEX) 1 MG tablet Take 1 mg by mouth at bedtime.  ? pravastatin (PRAVACHOL) 20 MG tablet Take 20 mg by mouth 2 (two) times a week.  ? tiZANidine (ZANAFLEX) 4 MG tablet Take 4 mg by mouth 3 (three) times daily as needed for muscle spasms.  ? zolpidem (AMBIEN) 10 MG tablet Take 10 mg by mouth at bedtime as needed for sleep.  ?  ? ?Allergies:   Shellfish allergy  ? ?Social History  ? ?Socioeconomic History  ? Marital status: Married  ?  Spouse name: Not on file  ? Number of children: Not on file  ? Years of education: Not on file  ? Highest education level: Not on file  ?Occupational History  ? Not on file  ?Tobacco Use  ? Smoking status: Never  ? Smokeless tobacco: Never  ?Vaping Use  ? Vaping Use: Never used  ?Substance and Sexual Activity  ? Alcohol use: Not Currently  ? Drug use: Never  ? Sexual activity: Not on file  ?Other Topics Concern  ? Not on file  ?Social History Narrative  ? Not on file  ? ?Social Determinants of Health  ? ?Financial Resource Strain: Not on file  ?Food Insecurity: Not on file  ?Transportation Needs: Not on file  ?Physical Activity: Not on file  ?Stress: Not on file  ?Social  Connections: Not on file  ?  ? ?Family History: ?The patient's family history includes CAD in her mother; Dementia in her mother; Diabetes in her father and sister; Heart disease in her mother; Hypertension in her brother, father, and sister; Lung cancer in her brother. ?ROS:   ?Please see the history of present illness.    ?All 14 point review of systems negative except as described per history of present illness ? ?EKGs/Labs/Other Studies Reviewed:   ? ? ? ?Recent Labs: ?No results found for requested labs within  last 8760 hours.  ?Recent Lipid Panel ?No results found for: CHOL, TRIG, HDL, CHOLHDL, VLDL, LDLCALC, LDLDIRECT ? ?Physical Exam:   ? ?VS:  BP 138/68 (BP Location: Left Arm, Patient Position: Sitting)   Pulse 65   Ht 5' (1.524 m)   Wt 165 lb 12.8 oz (75.2 kg)   SpO2 98%   BMI 32.38 kg/m?    ? ?Wt Readings from Last 3 Encounters:  ?12/04/21 165 lb 12.8 oz (75.2 kg)  ?11/15/21 170 lb (77.1 kg)  ?10/06/20 170 lb (77.1 kg)  ?  ? ?GEN:  Well nourished, well developed in no acute distress ?HEENT: Normal ?NECK: No JVD; No carotid bruits ?LYMPHATICS: No lymphadenopathy ?CARDIAC: RRR, no murmurs, no rubs, no gallops ?RESPIRATORY:  Clear to auscultation without rales, wheezing or rhonchi  ?ABDOMEN: Soft, non-tender, non-distended ?MUSCULOSKELETAL:  No edema; No deformity  ?SKIN: Warm and dry ?LOWER EXTREMITIES: no swelling ?NEUROLOGIC:  Alert and oriented x 3 ?PSYCHIATRIC:  Normal affect  ? ?ASSESSMENT:   ? ?1. Essential hypertension   ?2. Paroxysmal atrial fibrillation (HCC)   ?3. Diabetes mellitus type 2 in obese [facility])   ?4. Mixed dyslipidemia   ?5. Chest pain, unspecified type   ? ?PLAN:   ? ?In order of problems listed above: ? ?Paroxysmal atrial fibrillation, her CHADS2 Vascor equals 5.  We will continue anticoagulation.  I will ask her to wear Zio patch to see if she got frequent episode of atrial fibrillation to make a decision about potential antiarrhythmic.  As a part of evaluation I will schedule her  to have a sleep study.  She tells me she snores a lot I suspect she does have significant sleep apnea.  Also part of evaluation echocardiogram will be performed that will be to check left ventricle ejection fraction

## 2021-12-04 NOTE — Patient Instructions (Signed)
Medication Instructions:  ?Your physician has recommended you make the following change in your medication:  ?START: Crestor '10mg'$  1 tablet by mouth every day.  ? ? ?Lab Work: ?None Ordered ?If you have labs (blood work) drawn today and your tests are completely normal, you will receive your results only by: ?MyChart Message (if you have MyChart) OR ?A paper copy in the mail ?If you have any lab test that is abnormal or we need to change your treatment, we will call you to review the results. ? ? ?Testing/Procedures: ? ?Your physician has requested that you have an echocardiogram.  ?Echocardiography is a painless test that uses sound waves to create images of your heart. It provides your doctor with information about the size and shape of your heart and how well your heart?s chambers and valves are working. This procedure takes approximately one hour. There are no restrictions for this procedure.  ? ? ?WHY IS MY DOCTOR PRESCRIBING ZIO? ?The Zio system is proven and trusted by physicians to detect and diagnose irregular heart rhythms -- and has been prescribed to hundreds of thousands of patients. ? ?The FDA has cleared the Zio system to monitor for many different kinds of irregular heart rhythms. In a study, physicians were able to reach a diagnosis 90% of the time with the Zio system1. ? ?You can wear the Zio monitor -- a small, discreet, comfortable patch -- during your normal day-to-day activity, including while you sleep, shower, and exercise, while it records every single heartbeat for analysis. ? ?1Barrett, P., et al. Comparison of 24 Hour Holter Monitoring Versus 14 Day Novel Adhesive Patch Electrocardiographic Monitoring. [place], 2014. ? ?ZIO VS. HOLTER MONITORING ?The Zio monitor can be comfortably worn for up to 14 days. Holter monitors can be worn for 24 to 48 hours, limiting the time to record any irregular heart rhythms you may have. Zio is able to capture data for the 51% of  patients who have their first symptom-triggered arrhythmia after 48 hours.1 ? ?LIVE WITHOUT RESTRICTIONS ?The Zio ambulatory cardiac monitor is a small, unobtrusive, and water-resistant patch--you might even forget you?re wearing it. The Zio monitor records and stores every beat of your heart, whether you're sleeping, working out, or showering.   ? ? ?Follow-Up: ?At [facility], you and your health needs are our priority.  As part of our continuing mission to provide you with exceptional heart care, we have created designated Provider Care Teams.  These Care Teams include your primary Cardiologist (physician) and Advanced Practice Providers (APPs -  Physician Assistants and Nurse Practitioners) who all work together to provide you with the care you need, when you need it. ? ?We recommend signing up for the patient portal called "MyChart".  Sign up information is provided on this After Visit Summary.  MyChart is used to connect with patients for Virtual Visits (Telemedicine).  Patients are able to view lab/test results, encounter notes, upcoming appointments, etc.  Non-urgent messages can be sent to your provider as well.   ?To learn more about what you can do with MyChart, go to [email or web address].   ? ?Your next appointment:   ?2 month(s) ? ?The format for your next appointment:   ?In Person ? ?Provider:   ?Jenne Campus, MD  ? ? ?Other Instructions ?NA  ?

## 2021-12-12 DIAGNOSIS — E11319 Type 2 diabetes mellitus with unspecified diabetic retinopathy without macular edema: Secondary | ICD-10-CM | POA: Diagnosis not present

## 2021-12-12 DIAGNOSIS — E1165 Type 2 diabetes mellitus with hyperglycemia: Secondary | ICD-10-CM | POA: Diagnosis not present

## 2021-12-12 DIAGNOSIS — E1140 Type 2 diabetes mellitus with diabetic neuropathy, unspecified: Secondary | ICD-10-CM | POA: Diagnosis not present

## 2021-12-12 DIAGNOSIS — M8580 Other specified disorders of bone density and structure, unspecified site: Secondary | ICD-10-CM | POA: Diagnosis not present

## 2021-12-12 DIAGNOSIS — R7401 Elevation of levels of liver transaminase levels: Secondary | ICD-10-CM | POA: Diagnosis not present

## 2021-12-12 DIAGNOSIS — I491 Atrial premature depolarization: Secondary | ICD-10-CM | POA: Diagnosis not present

## 2021-12-12 DIAGNOSIS — E559 Vitamin D deficiency, unspecified: Secondary | ICD-10-CM | POA: Diagnosis not present

## 2021-12-13 DIAGNOSIS — R600 Localized edema: Secondary | ICD-10-CM | POA: Diagnosis not present

## 2021-12-13 DIAGNOSIS — I480 Paroxysmal atrial fibrillation: Secondary | ICD-10-CM | POA: Diagnosis not present

## 2021-12-13 DIAGNOSIS — E1142 Type 2 diabetes mellitus with diabetic polyneuropathy: Secondary | ICD-10-CM | POA: Diagnosis not present

## 2021-12-13 DIAGNOSIS — E785 Hyperlipidemia, unspecified: Secondary | ICD-10-CM | POA: Diagnosis not present

## 2021-12-13 DIAGNOSIS — E559 Vitamin D deficiency, unspecified: Secondary | ICD-10-CM | POA: Diagnosis not present

## 2021-12-13 DIAGNOSIS — I10 Essential (primary) hypertension: Secondary | ICD-10-CM | POA: Diagnosis not present

## 2021-12-17 ENCOUNTER — Telehealth: Payer: Self-pay

## 2021-12-17 NOTE — Telephone Encounter (Signed)
Patient is coming on 12/20/21  Sleep Apnea Evaluation   [facility]  Today's Date: 12/17/2021   Patient Name: Casey Watkins        DOB: 1946-03-23       Height:   5     Weight:   165 BMI: There is no height or weight on file to calculate BMI.    Referring Provider:  Dr. Jenne Campus   STOP-BANG RISK ASSESSMENT         If STOP-BANG Score ?3 OR two clinical symptoms - patient qualifies for WatchPAT (CPT 95800)      Sleep study ordered due to two (2) of the following clinical symptoms/diagnoses:  Excessive daytime sleepiness G47.10  Gastroesophageal reflux K21.9  Nocturia R35.1  Morning Headaches G44.221  Difficulty concentrating R41.840  Memory problems or poor judgment G31.84  Personality changes or irritability R45.4  Loud snoring R06.83  Depression F32.9  Unrefreshed by sleep G47.8  Impotence N52.9  History of high blood pressure R03.0  Insomnia G47.00  Sleep Disordered Breathing or Sleep Apnea ICD G47.33

## 2021-12-19 DIAGNOSIS — I480 Paroxysmal atrial fibrillation: Secondary | ICD-10-CM | POA: Diagnosis not present

## 2021-12-19 DIAGNOSIS — M545 Low back pain, unspecified: Secondary | ICD-10-CM | POA: Diagnosis not present

## 2021-12-25 ENCOUNTER — Ambulatory Visit (INDEPENDENT_AMBULATORY_CARE_PROVIDER_SITE_OTHER): Payer: Medicare Other

## 2021-12-25 DIAGNOSIS — R0609 Other forms of dyspnea: Secondary | ICD-10-CM | POA: Diagnosis not present

## 2021-12-25 LAB — ECHOCARDIOGRAM COMPLETE
AR max vel: 1.42 cm2
AV Area VTI: 1.67 cm2
AV Area mean vel: 1.56 cm2
AV Mean grad: 8 mmHg
AV Peak grad: 15.7 mmHg
Ao pk vel: 1.98 m/s
Area-P 1/2: 3.42 cm2
S' Lateral: 2.6 cm

## 2021-12-26 ENCOUNTER — Telehealth: Payer: Self-pay

## 2021-12-26 NOTE — Telephone Encounter (Signed)
Spoke with pt about monitor results. She stated that her symptoms are not symptomatic at this time. She said her PCP gave her medication that has helped. She will bring the medications with her to her next appt. She verbalized understanding and had no further questions.

## 2022-01-01 ENCOUNTER — Telehealth: Payer: Self-pay | Admitting: *Deleted

## 2022-01-01 NOTE — Telephone Encounter (Signed)
Called pt to inquire about the sleep study that Dr. Agustin Cree ordered for pt. She stated that she had decided not to do it. Will put device back in stock.

## 2022-01-03 DIAGNOSIS — M47816 Spondylosis without myelopathy or radiculopathy, lumbar region: Secondary | ICD-10-CM | POA: Diagnosis not present

## 2022-01-18 ENCOUNTER — Other Ambulatory Visit: Payer: Self-pay

## 2022-01-18 MED ORDER — ROSUVASTATIN CALCIUM 10 MG PO TABS: 10.0000 mg | ORAL_TABLET | Freq: Every day | ORAL | 2 refills | Status: DC

## 2022-01-18 NOTE — Telephone Encounter (Signed)
Called patient to verify that she was not taking the Pravastatin that was listed on the patient's medication list at her last office visit as Dr. Bing Matter prescribed her the Rosuvastatin.  Spoke with Peyton Najjar her husband per patient's and found patient was in fact taking both medications.  Advised patient to discontinue the Pravastatin sine Dr. Bing Matter just prescribed the Rosuvastatin at her last office visit.

## 2022-01-30 DIAGNOSIS — M47896 Other spondylosis, lumbar region: Secondary | ICD-10-CM | POA: Diagnosis not present

## 2022-01-30 DIAGNOSIS — M545 Low back pain, unspecified: Secondary | ICD-10-CM | POA: Diagnosis not present

## 2022-01-30 DIAGNOSIS — M419 Scoliosis, unspecified: Secondary | ICD-10-CM | POA: Diagnosis not present

## 2022-02-01 DIAGNOSIS — L820 Inflamed seborrheic keratosis: Secondary | ICD-10-CM | POA: Diagnosis not present

## 2022-02-01 DIAGNOSIS — D485 Neoplasm of uncertain behavior of skin: Secondary | ICD-10-CM | POA: Diagnosis not present

## 2022-02-06 ENCOUNTER — Ambulatory Visit: Payer: Medicare Other | Admitting: Internal Medicine

## 2022-02-13 DIAGNOSIS — C44329 Squamous cell carcinoma of skin of other parts of face: Secondary | ICD-10-CM | POA: Diagnosis not present

## 2022-03-01 DIAGNOSIS — K645 Perianal venous thrombosis: Secondary | ICD-10-CM | POA: Diagnosis not present

## 2022-03-04 DIAGNOSIS — R413 Other amnesia: Secondary | ICD-10-CM | POA: Diagnosis not present

## 2022-03-04 DIAGNOSIS — Z5181 Encounter for therapeutic drug level monitoring: Secondary | ICD-10-CM | POA: Diagnosis not present

## 2022-03-04 DIAGNOSIS — M47896 Other spondylosis, lumbar region: Secondary | ICD-10-CM | POA: Diagnosis not present

## 2022-03-05 DIAGNOSIS — K602 Anal fissure, unspecified: Secondary | ICD-10-CM | POA: Diagnosis not present

## 2022-03-08 ENCOUNTER — Encounter: Payer: Self-pay | Admitting: Cardiology

## 2022-03-08 ENCOUNTER — Ambulatory Visit: Payer: Medicare Other | Admitting: Cardiology

## 2022-03-08 VITALS — BP 120/70 | HR 68 | Ht 60.0 in | Wt 169.0 lb

## 2022-03-08 DIAGNOSIS — Z6833 Body mass index (BMI) 33.0-33.9, adult: Secondary | ICD-10-CM

## 2022-03-08 DIAGNOSIS — E6609 Other obesity due to excess calories: Secondary | ICD-10-CM | POA: Diagnosis not present

## 2022-03-08 DIAGNOSIS — I480 Paroxysmal atrial fibrillation: Secondary | ICD-10-CM

## 2022-03-08 DIAGNOSIS — I10 Essential (primary) hypertension: Secondary | ICD-10-CM

## 2022-03-08 DIAGNOSIS — E1169 Type 2 diabetes mellitus with other specified complication: Secondary | ICD-10-CM | POA: Diagnosis not present

## 2022-03-08 DIAGNOSIS — E669 Obesity, unspecified: Secondary | ICD-10-CM

## 2022-03-08 NOTE — Patient Instructions (Signed)

## 2022-03-08 NOTE — Progress Notes (Unsigned)
Cardiology Office Note:    Date:  03/08/2022   ID:  Casey Watkins Jan 29, 1946, MRN [id number]  PCP:  Nicoletta Dress, MD  Cardiologist:  Jenne Campus, MD    Referring MD: Nicoletta Dress, MD   No chief complaint on file. Doing fine  History of Present Illness:    Casey Watkins is a 76 y.o. female with past medical history significant for essential hypertension, diabetes, dyslipidemia, paroxysmal atrial fibrillation she is on Eliquis.  She was sent to [place] for atrial fibrillation.  Echocardiogram showed preserved ejection fraction normal atrial size however she does have diastolic dysfunction, she did have monitor which did not show any evidence of atrial fibrillation however she did have some supraventricular tachycardia.  She comes today to my office for follow-up.  She denies have any cardiac complaints, she said palpitations are practically subsided.  She complain of having hemorrhoids which prevent her from walking more.  Past Medical History:  Diagnosis Date   Acquired hammertoes of both feet 04/11/2020   APC (atrial premature contractions)    Atrial fibrillation, controlled (HCC)    Cardiac murmur 02/02/2020   Cataract    Left   Chest pain 02/02/2020   Chronic GERD    Chronic insomnia    Cirrhosis of liver (HCC)    Class 1 obesity due to excess calories with body mass index (BMI) of 33.0 to 33.9 in adult 10/06/2020   Degeneration of lumbar intervertebral disc    Diabetes mellitus type 2 in obese ([facility]) 02/02/2020   Essential hypertension 02/02/2020   Facet arthritis of lumbar region    History of kidney stones    Hyperlipidemia    Hypertension    Hypopotassemia    Leg cramps    Menopausal state    Mixed dyslipidemia 95/03/3266   Non-alcoholic fatty liver disease    Onychomycosis due to dermatophyte 04/11/2020   Osteopenia of multiple sites    Other acquired deformity of toe 04/11/2020   Pain in joint of right hip 09/09/2019   Palpitations 02/02/2020    Spinal stenosis of lumbar region 10/06/2020   Spinal stenosis, lumbar 10/06/2020   Uncontrolled type 2 diabetes mellitus with peripheral neuropathy    Vitamin D deficiency     Past Surgical History:  Procedure Laterality Date   APPENDECTOMY  1977   BLADDER SURGERY  06/26/2017   Bladder tack up by Dr. Rosario Adie at [place] Right 2005   CATARACT EXTRACTION Bilateral 08/2018   Dr. Elodia Florence   LUMBAR LAMINECTOMY/DECOMPRESSION MICRODISCECTOMY Left 10/06/2020   Procedure: Microlumbar decompression Lumbar five through Sacral one left and excision of synovial cyst;  Surgeon: Susa Day, MD;  Location: [facility];  Service: Orthopedics;  Laterality: Left;  2.5 hrs   MEDIAL PARTIAL KNEE REPLACEMENT Right 2006   MEDIAL PARTIAL KNEE REPLACEMENT Left 2000   REPLACEMENT TOTAL KNEE Right 2012   TOTAL KNEE ARTHROPLASTY Left 2008   TUBAL LIGATION  1977    Current Medications: No outpatient medications have been marked as taking for the 03/08/22 encounter (Office Visit) with Park Liter, MD.     Allergies:   Shellfish allergy   Social History   Socioeconomic History   Marital status: Married    Spouse name: Not on file   Number of children: Not on file   Years of education: Not on file   Highest education level: Not on file  Occupational History   Not on file  Tobacco Use   Smoking status: Never   Smokeless tobacco: Never  Vaping Use   Vaping Use: Never used  Substance and Sexual Activity   Alcohol use: Not Currently   Drug use: Never   Sexual activity: Not on file  Other Topics Concern   Not on file  Social History Narrative   Not on file   Social Determinants of Health   Financial Resource Strain: Not on file  Food Insecurity: Not on file  Transportation Needs: Not on file  Physical Activity: Not on file  Stress: Not on file  Social Connections: Not on file     Family History: The patient's family history includes CAD in her  mother; Dementia in her mother; Diabetes in her father and sister; Heart disease in her mother; Hypertension in her brother, father, and sister; Lung cancer in her brother. ROS:   Please see the history of present illness.    All 14 point review of systems negative except as described per history of present illness  EKGs/Labs/Other Studies Reviewed:      Recent Labs: No results found for requested labs within last 365 days.  Recent Lipid Panel No results found for: "CHOL", "TRIG", "HDL", "CHOLHDL", "VLDL", "LDLCALC", "LDLDIRECT"  Physical Exam:    VS:  BP 120/70 (BP Location: Left Arm, Patient Position: Sitting, Cuff Size: Normal)   Pulse 68   Ht 5' (1.524 m)   Wt 169 lb (76.7 kg)   SpO2 93%   BMI 33.01 kg/m     Wt Readings from Last 3 Encounters:  03/08/22 169 lb (76.7 kg)  12/04/21 165 lb 12.8 oz (75.2 kg)  11/15/21 170 lb (77.1 kg)     GEN:  Well nourished, well developed in no acute distress HEENT: Normal NECK: No JVD; No carotid bruits LYMPHATICS: No lymphadenopathy CARDIAC: RRR, no murmurs, no rubs, no gallops RESPIRATORY:  Clear to auscultation without rales, wheezing or rhonchi  ABDOMEN: Soft, non-tender, non-distended MUSCULOSKELETAL:  No edema; No deformity  SKIN: Warm and dry LOWER EXTREMITIES: no swelling NEUROLOGIC:  Alert and oriented x 3 PSYCHIATRIC:  Normal affect   ASSESSMENT:    1. Paroxysmal atrial fibrillation (HCC)   2. Essential hypertension   3. Diabetes mellitus type 2 in obese (HCC)   4. Class 1 obesity due to excess calories with serious comorbidity and body mass index (BMI) of 33.0 to 33.9 in adult    PLAN:    In order of problems listed above:  Paroxysmal atrial fibrillation, she is anticoagulated CHADS2 Vascor equals 5.  Continue present management.  Echocardiogram reviewed showed normal atrial size. Assess hypertension, blood pressure seems to be well-controlled continue present management. Dyslipidemia I did review her K PN which  only her data from 12/13/2021 with LDL of 73 HDL 40.  She is on Crestor 10 which is moderate intense statin which I will continue. Diabetes that being followed by internal medicine team sadly her hemoglobin A1c is 5.6 this is data from K PN from 12/13/2021.  She understands the problem trying to work on better controlling diabetes.   Medication Adjustments/Labs and Tests Ordered: Current medicines are reviewed at length with the patient today.  Concerns regarding medicines are outlined above.  No orders of the defined types were placed in this encounter.  Medication changes: No orders of the defined types were placed in this encounter.   Signed, Park Liter, MD, [facility] 03/08/2022 9:36 AM    [place]

## 2022-03-12 IMAGING — CR DG LUMBAR SPINE 2-3V
3 series · 3 of 3 positions shown · non-contrast
Comparison: 12/19/2017.

CLINICAL DATA: Preop lumbar exam.

EXAM:
LUMBAR SPINE - 2-3 VIEW

[w lumbar spine ap]
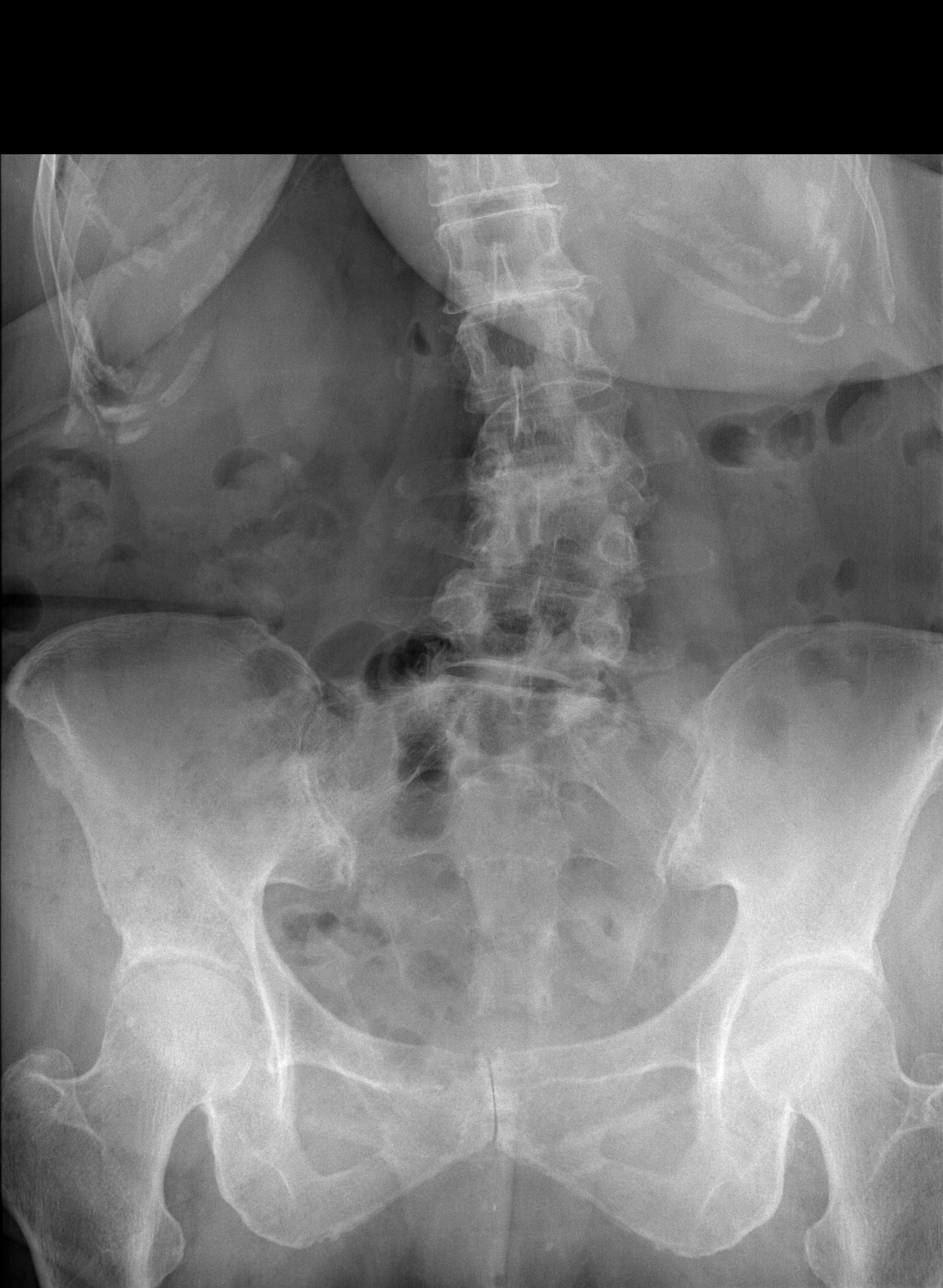

[w lumbar spine lat]
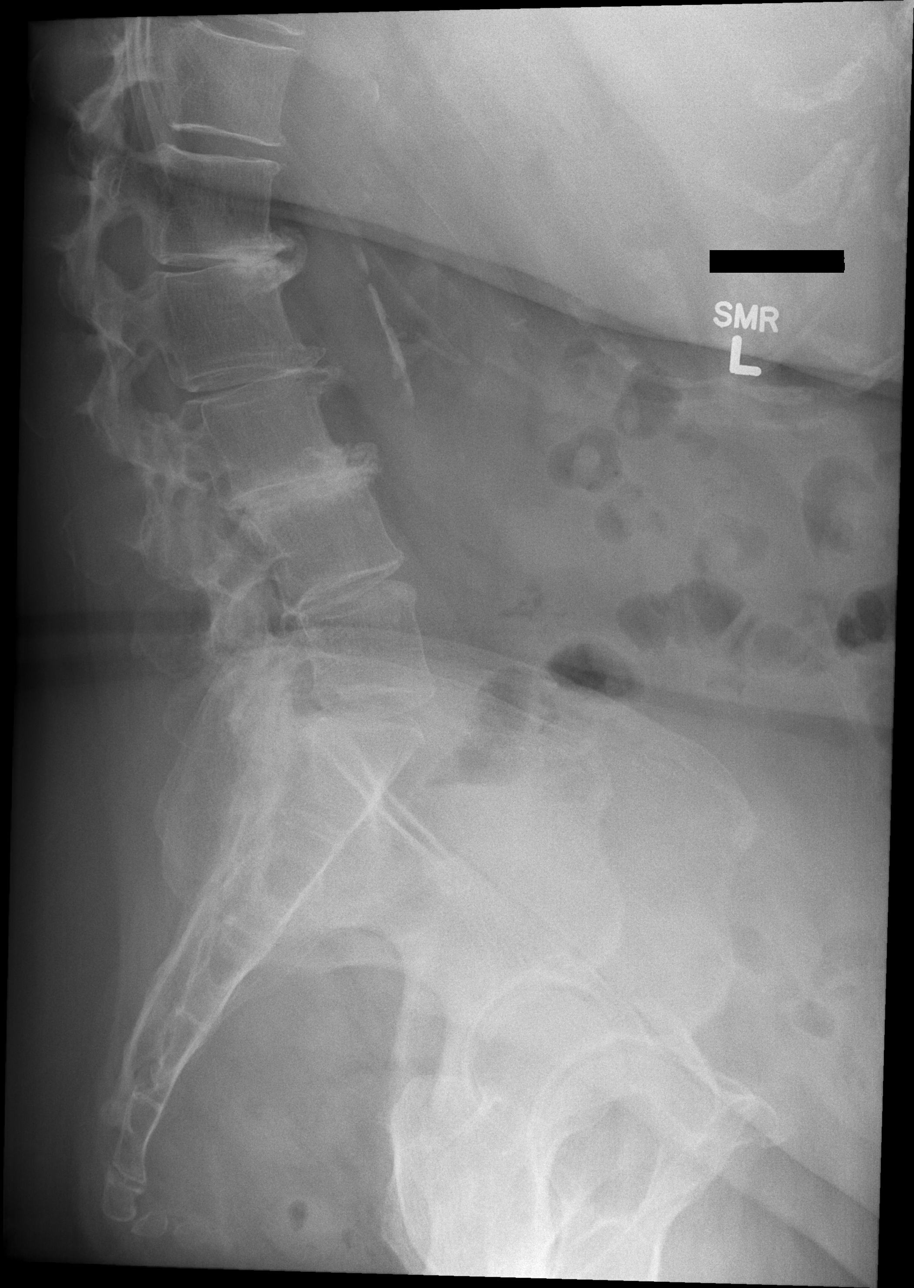

[w lumbar l-5 s-1 spot]
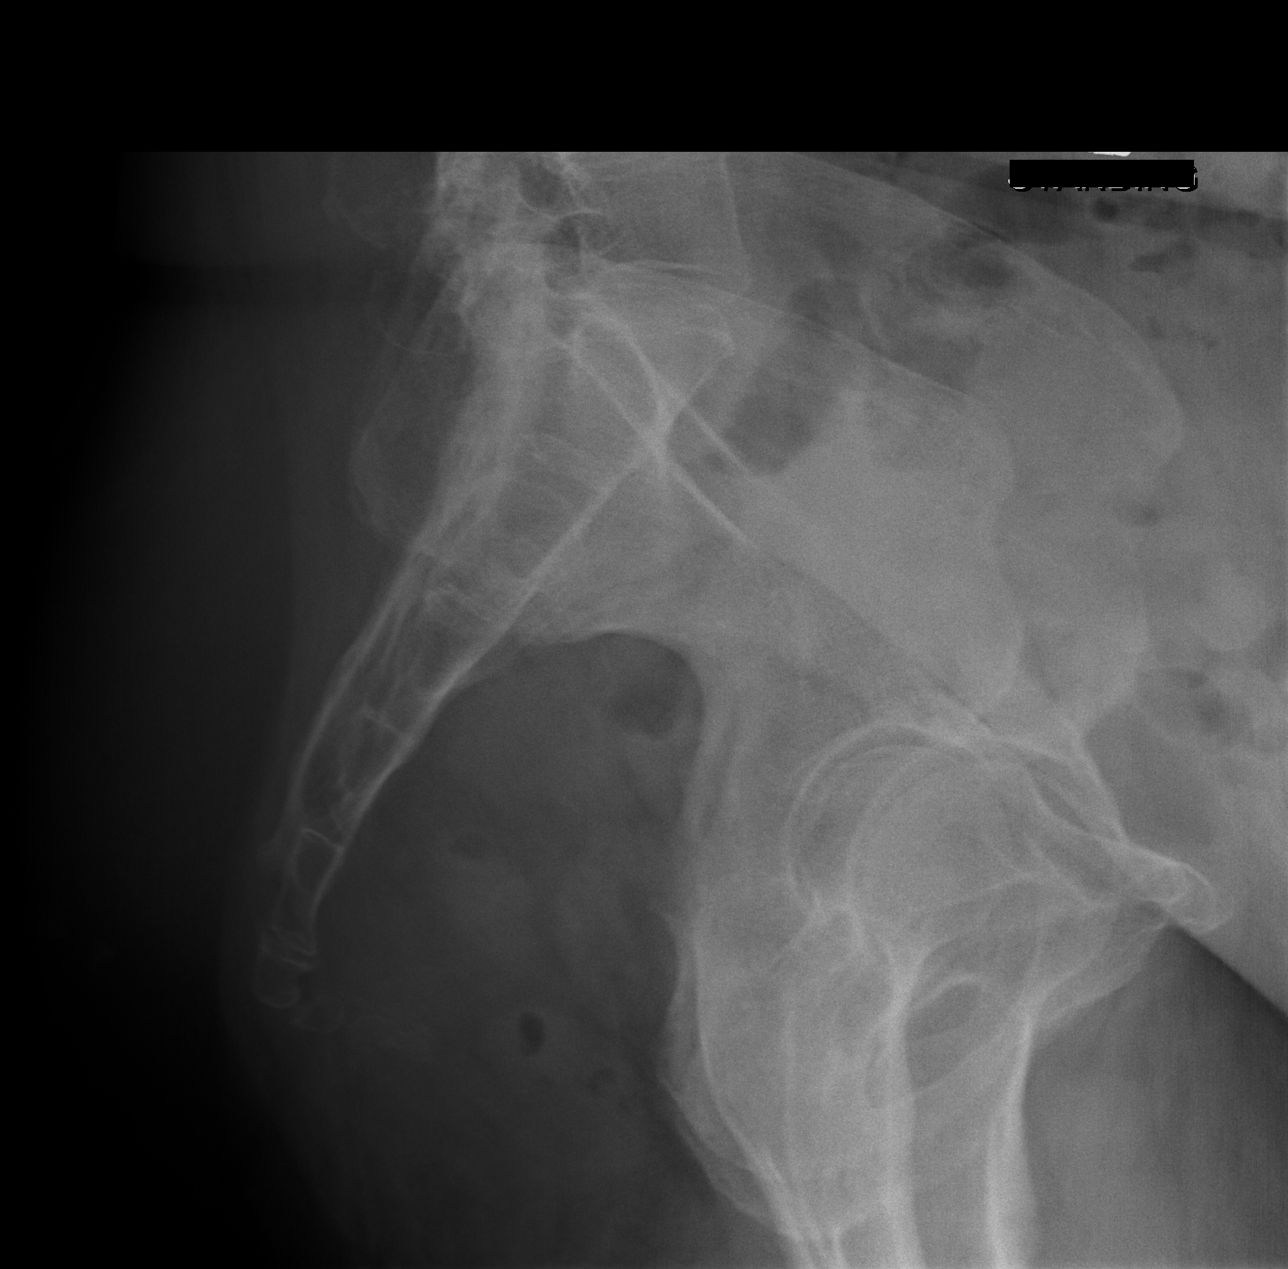

[3 of 3 positions shown; findings below may reference images not displayed]

FINDINGS: Lumbar spine numbered with the lowest segmented appearing lumbar
shaped vertebrae on lateral view as L5. Lumbar scoliosis concave
right. Diffuse multilevel degenerative change with prominent
multilevel disc space loss and endplate osteophyte formation. 3 mm
retrolisthesis L2 on L3, L3 on L4, L4 on L5. No acute bony
abnormality identified. Aortic atherosclerotic vascular
calcification. Right upper quadrant calcifications, these could
represent gallstones and or renal stones. Undigested pill fragments
could also present in this fashion.
IMPRESSION: 1. Lumbar spine scoliosis concave right.
2. Diffuse multilevel degenerative change with prominent multilevel
disc space loss and endplate osteophyte formation. 3 mm
retrolisthesis L2 on L3, L3 on L4, L4 on L5. No acute bony
abnormality identified.
3. Right upper quadrant calcifications. These could represent
gallstones and or renal stones. Undigested pill fragment could also
present this fashion.

## 2022-03-14 IMAGING — CR DG LUMBAR SPINE 2-3V
3 series · 3 of 3 positions shown · non-contrast
Comparison: Radiographs 10/04/2020

CLINICAL DATA: Intraoperative localization for spine surgery.

EXAM:
LUMBAR SPINE - 2-3 VIEW

[lateral (1 of 3)]
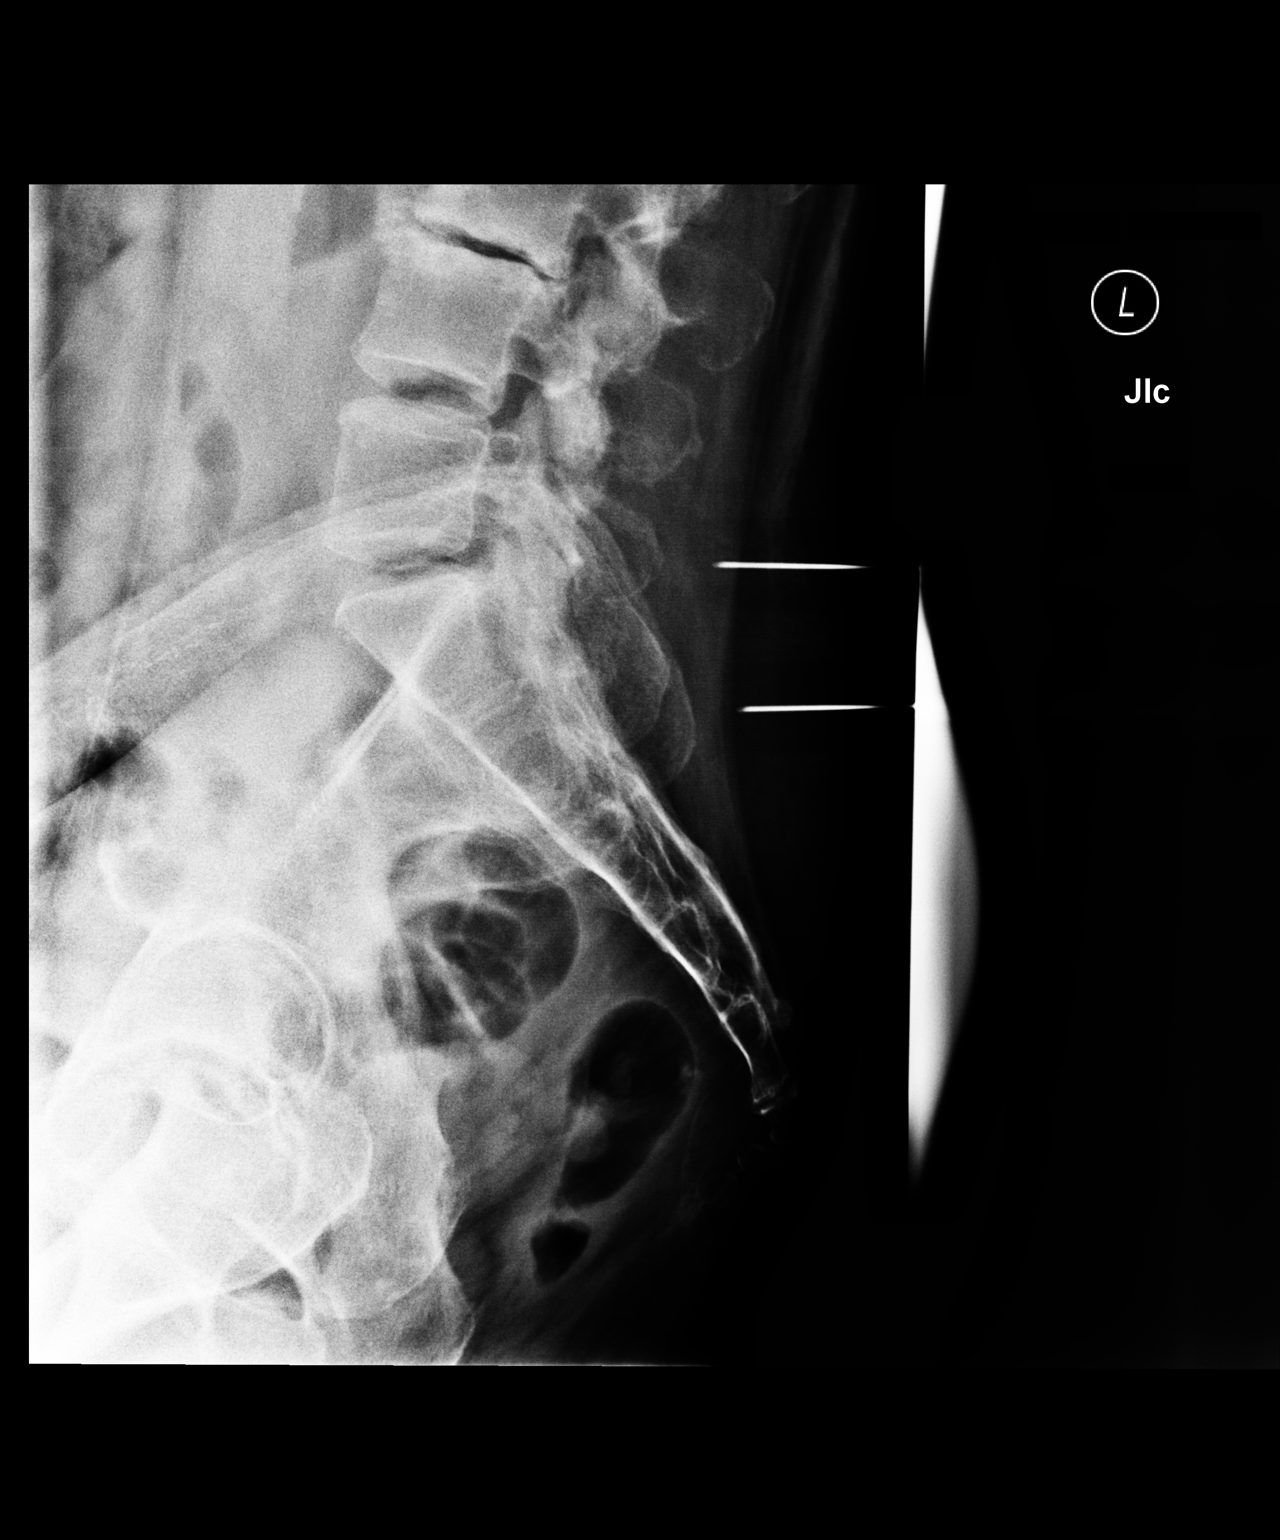

[lateral (2 of 3)]
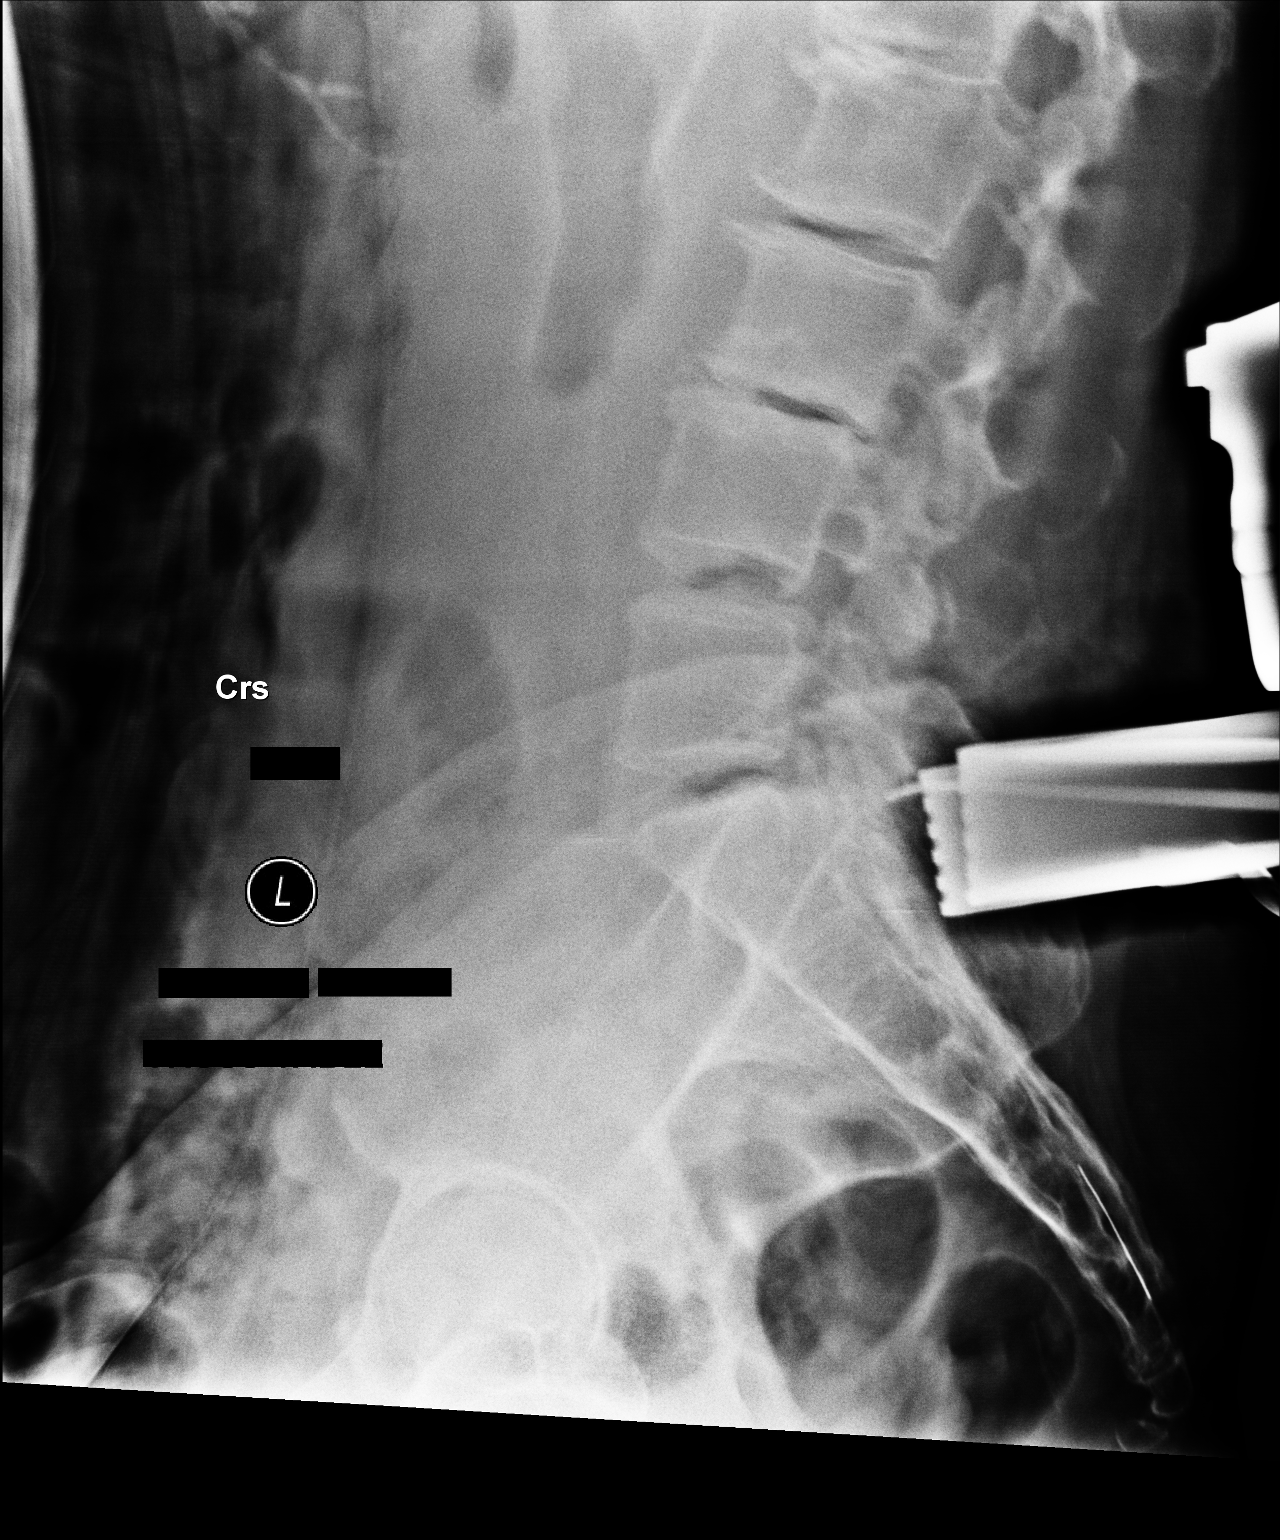

[lateral (3 of 3)]
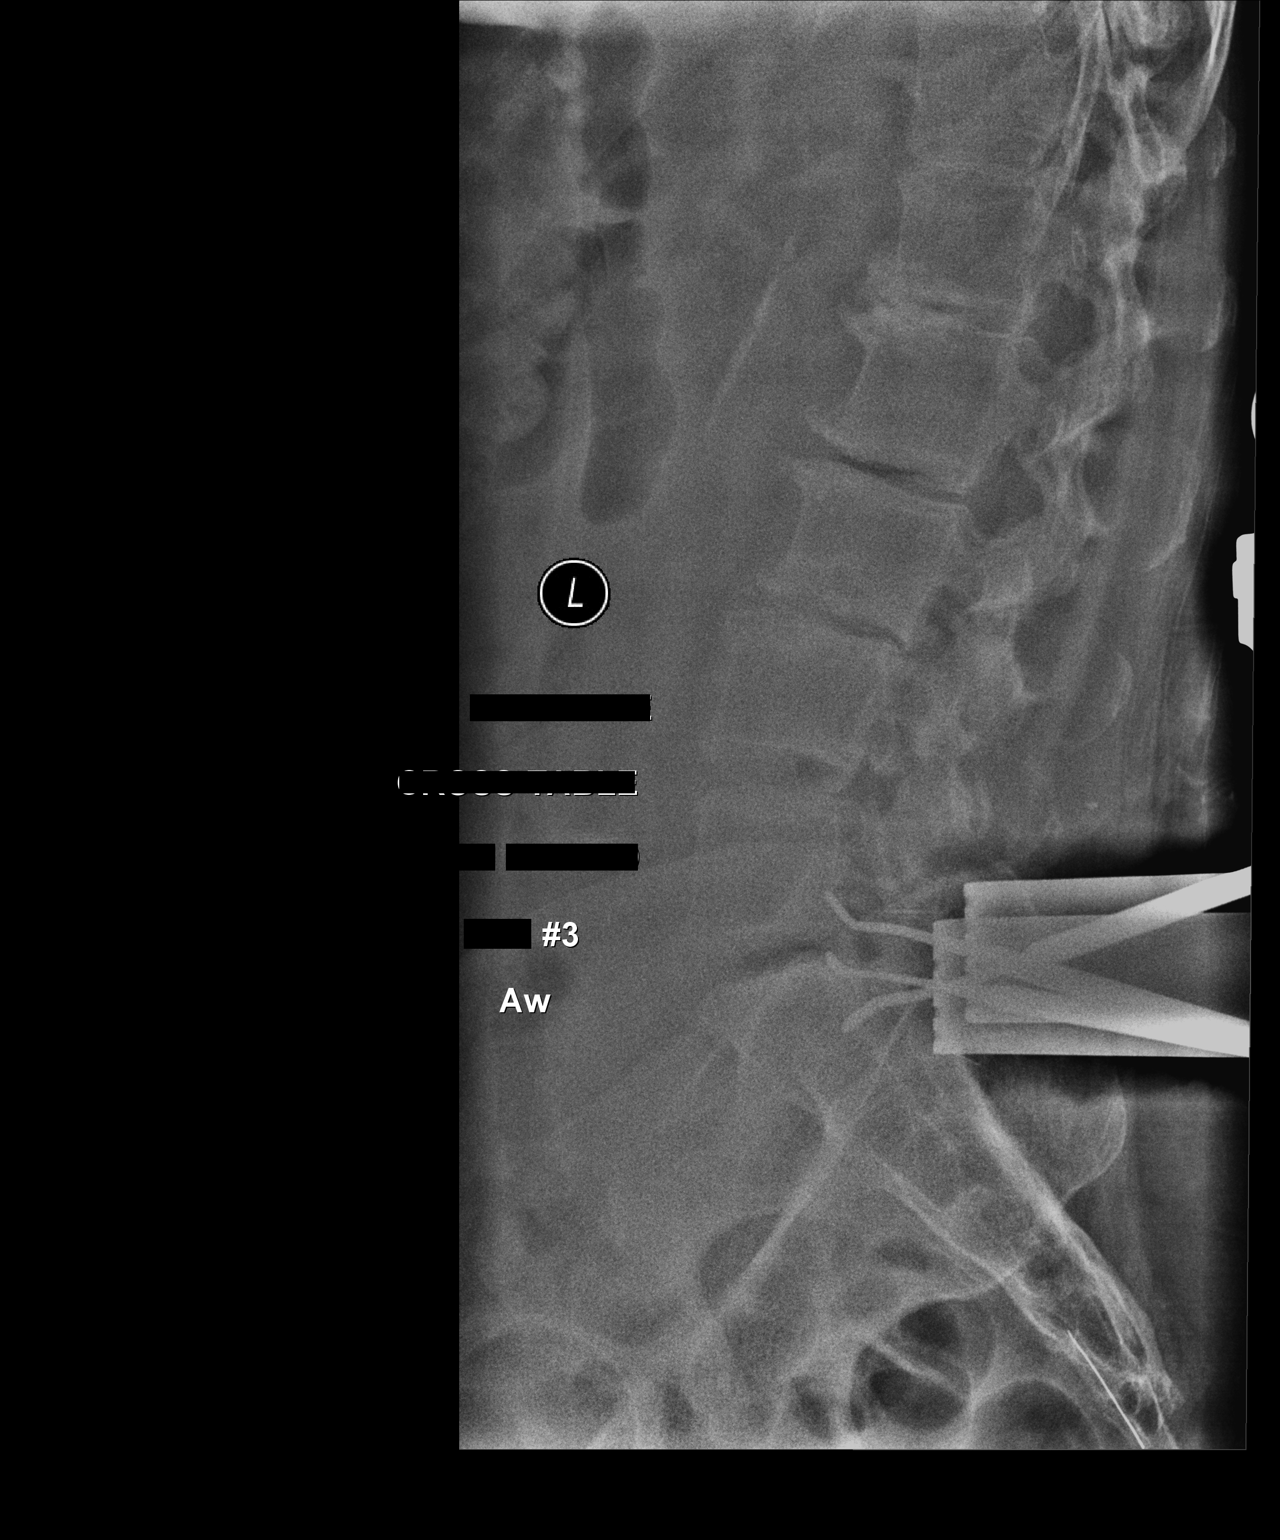

[3 of 3 positions shown; findings below may reference images not displayed]

FINDINGS: The first film demonstrates spinal needles posteriorly marking the
L5-S1 and S1-2 levels.

The second film demonstrates surgical retractors and a surgical
insert marking the L5-S1 disc space.

The third film demonstrates surgical retractors and surgical
instruments marking the L5-S1 disc space.
IMPRESSION: Intraoperative localization of L5-S1.

## 2022-03-15 DIAGNOSIS — E1142 Type 2 diabetes mellitus with diabetic polyneuropathy: Secondary | ICD-10-CM | POA: Diagnosis not present

## 2022-03-15 DIAGNOSIS — E785 Hyperlipidemia, unspecified: Secondary | ICD-10-CM | POA: Diagnosis not present

## 2022-03-15 DIAGNOSIS — E559 Vitamin D deficiency, unspecified: Secondary | ICD-10-CM | POA: Diagnosis not present

## 2022-03-15 DIAGNOSIS — I10 Essential (primary) hypertension: Secondary | ICD-10-CM | POA: Diagnosis not present

## 2022-03-15 DIAGNOSIS — R600 Localized edema: Secondary | ICD-10-CM | POA: Diagnosis not present

## 2022-03-15 DIAGNOSIS — I480 Paroxysmal atrial fibrillation: Secondary | ICD-10-CM | POA: Diagnosis not present

## 2022-03-22 DIAGNOSIS — R413 Other amnesia: Secondary | ICD-10-CM | POA: Diagnosis not present

## 2022-03-22 DIAGNOSIS — E538 Deficiency of other specified B group vitamins: Secondary | ICD-10-CM | POA: Diagnosis not present

## 2022-03-22 DIAGNOSIS — E1142 Type 2 diabetes mellitus with diabetic polyneuropathy: Secondary | ICD-10-CM | POA: Diagnosis not present

## 2022-03-22 DIAGNOSIS — I10 Essential (primary) hypertension: Secondary | ICD-10-CM | POA: Diagnosis not present

## 2022-03-26 DIAGNOSIS — K602 Anal fissure, unspecified: Secondary | ICD-10-CM | POA: Diagnosis not present

## 2022-03-28 DIAGNOSIS — I10 Essential (primary) hypertension: Secondary | ICD-10-CM | POA: Diagnosis not present

## 2022-03-28 DIAGNOSIS — Z794 Long term (current) use of insulin: Secondary | ICD-10-CM | POA: Diagnosis not present

## 2022-03-28 DIAGNOSIS — K648 Other hemorrhoids: Secondary | ICD-10-CM | POA: Diagnosis not present

## 2022-03-28 DIAGNOSIS — K645 Perianal venous thrombosis: Secondary | ICD-10-CM | POA: Diagnosis not present

## 2022-03-28 DIAGNOSIS — K602 Anal fissure, unspecified: Secondary | ICD-10-CM | POA: Diagnosis not present

## 2022-03-28 DIAGNOSIS — E119 Type 2 diabetes mellitus without complications: Secondary | ICD-10-CM | POA: Diagnosis not present

## 2022-04-01 DIAGNOSIS — R81 Glycosuria: Secondary | ICD-10-CM | POA: Diagnosis not present

## 2022-04-01 DIAGNOSIS — R350 Frequency of micturition: Secondary | ICD-10-CM | POA: Diagnosis not present

## 2022-04-01 DIAGNOSIS — R300 Dysuria: Secondary | ICD-10-CM | POA: Diagnosis not present

## 2022-04-19 DIAGNOSIS — R413 Other amnesia: Secondary | ICD-10-CM | POA: Diagnosis not present

## 2022-05-17 DIAGNOSIS — R413 Other amnesia: Secondary | ICD-10-CM | POA: Diagnosis not present

## 2022-05-17 DIAGNOSIS — Z23 Encounter for immunization: Secondary | ICD-10-CM | POA: Diagnosis not present

## 2022-05-28 DIAGNOSIS — L728 Other follicular cysts of the skin and subcutaneous tissue: Secondary | ICD-10-CM | POA: Diagnosis not present

## 2022-06-12 DIAGNOSIS — M5136 Other intervertebral disc degeneration, lumbar region: Secondary | ICD-10-CM | POA: Diagnosis not present

## 2022-06-12 DIAGNOSIS — M545 Low back pain, unspecified: Secondary | ICD-10-CM | POA: Diagnosis not present

## 2022-06-12 DIAGNOSIS — R413 Other amnesia: Secondary | ICD-10-CM | POA: Diagnosis not present

## 2022-06-12 DIAGNOSIS — M47816 Spondylosis without myelopathy or radiculopathy, lumbar region: Secondary | ICD-10-CM | POA: Diagnosis not present

## 2022-06-26 DIAGNOSIS — B9689 Other specified bacterial agents as the cause of diseases classified elsewhere: Secondary | ICD-10-CM | POA: Diagnosis not present

## 2022-06-26 DIAGNOSIS — J208 Acute bronchitis due to other specified organisms: Secondary | ICD-10-CM | POA: Diagnosis not present

## 2022-07-08 DIAGNOSIS — M47816 Spondylosis without myelopathy or radiculopathy, lumbar region: Secondary | ICD-10-CM | POA: Diagnosis not present

## 2022-07-18 DIAGNOSIS — D485 Neoplasm of uncertain behavior of skin: Secondary | ICD-10-CM | POA: Diagnosis not present

## 2022-08-03 DIAGNOSIS — C44329 Squamous cell carcinoma of skin of other parts of face: Secondary | ICD-10-CM | POA: Diagnosis not present

## 2022-08-21 DIAGNOSIS — R252 Cramp and spasm: Secondary | ICD-10-CM | POA: Diagnosis not present

## 2022-08-21 DIAGNOSIS — R413 Other amnesia: Secondary | ICD-10-CM | POA: Diagnosis not present

## 2022-08-21 DIAGNOSIS — Z9181 History of falling: Secondary | ICD-10-CM | POA: Diagnosis not present

## 2022-08-21 DIAGNOSIS — Z139 Encounter for screening, unspecified: Secondary | ICD-10-CM | POA: Diagnosis not present

## 2022-08-21 DIAGNOSIS — E559 Vitamin D deficiency, unspecified: Secondary | ICD-10-CM | POA: Diagnosis not present

## 2022-08-21 DIAGNOSIS — I10 Essential (primary) hypertension: Secondary | ICD-10-CM | POA: Diagnosis not present

## 2022-08-21 DIAGNOSIS — E785 Hyperlipidemia, unspecified: Secondary | ICD-10-CM | POA: Diagnosis not present

## 2022-08-21 DIAGNOSIS — E1142 Type 2 diabetes mellitus with diabetic polyneuropathy: Secondary | ICD-10-CM | POA: Diagnosis not present

## 2022-08-21 DIAGNOSIS — Z79899 Other long term (current) drug therapy: Secondary | ICD-10-CM | POA: Diagnosis not present

## 2022-08-21 DIAGNOSIS — I480 Paroxysmal atrial fibrillation: Secondary | ICD-10-CM | POA: Diagnosis not present

## 2022-08-26 DIAGNOSIS — E1142 Type 2 diabetes mellitus with diabetic polyneuropathy: Secondary | ICD-10-CM | POA: Diagnosis not present

## 2022-08-26 DIAGNOSIS — I10 Essential (primary) hypertension: Secondary | ICD-10-CM | POA: Diagnosis not present

## 2022-09-03 DIAGNOSIS — R42 Dizziness and giddiness: Secondary | ICD-10-CM | POA: Diagnosis not present

## 2022-09-03 DIAGNOSIS — E1142 Type 2 diabetes mellitus with diabetic polyneuropathy: Secondary | ICD-10-CM | POA: Diagnosis not present

## 2022-09-06 DIAGNOSIS — R42 Dizziness and giddiness: Secondary | ICD-10-CM | POA: Diagnosis not present

## 2022-09-09 DIAGNOSIS — E1142 Type 2 diabetes mellitus with diabetic polyneuropathy: Secondary | ICD-10-CM | POA: Diagnosis not present

## 2022-09-09 DIAGNOSIS — E11319 Type 2 diabetes mellitus with unspecified diabetic retinopathy without macular edema: Secondary | ICD-10-CM | POA: Diagnosis not present

## 2022-10-17 DIAGNOSIS — L821 Other seborrheic keratosis: Secondary | ICD-10-CM | POA: Diagnosis not present

## 2022-10-17 DIAGNOSIS — B079 Viral wart, unspecified: Secondary | ICD-10-CM | POA: Diagnosis not present

## 2022-10-17 DIAGNOSIS — C44329 Squamous cell carcinoma of skin of other parts of face: Secondary | ICD-10-CM | POA: Diagnosis not present

## 2022-11-22 DIAGNOSIS — I480 Paroxysmal atrial fibrillation: Secondary | ICD-10-CM | POA: Diagnosis not present

## 2022-11-22 DIAGNOSIS — I10 Essential (primary) hypertension: Secondary | ICD-10-CM | POA: Diagnosis not present

## 2022-11-22 DIAGNOSIS — E559 Vitamin D deficiency, unspecified: Secondary | ICD-10-CM | POA: Diagnosis not present

## 2022-11-22 DIAGNOSIS — R413 Other amnesia: Secondary | ICD-10-CM | POA: Diagnosis not present

## 2022-11-22 DIAGNOSIS — E785 Hyperlipidemia, unspecified: Secondary | ICD-10-CM | POA: Diagnosis not present

## 2022-11-22 DIAGNOSIS — E1142 Type 2 diabetes mellitus with diabetic polyneuropathy: Secondary | ICD-10-CM | POA: Diagnosis not present

## 2022-11-26 DIAGNOSIS — K8020 Calculus of gallbladder without cholecystitis without obstruction: Secondary | ICD-10-CM | POA: Diagnosis not present

## 2022-11-26 DIAGNOSIS — R7401 Elevation of levels of liver transaminase levels: Secondary | ICD-10-CM | POA: Diagnosis not present

## 2022-11-30 DIAGNOSIS — R9431 Abnormal electrocardiogram [ECG] [EKG]: Secondary | ICD-10-CM | POA: Diagnosis not present

## 2022-11-30 DIAGNOSIS — E785 Hyperlipidemia, unspecified: Secondary | ICD-10-CM | POA: Diagnosis not present

## 2022-11-30 DIAGNOSIS — Z7984 Long term (current) use of oral hypoglycemic drugs: Secondary | ICD-10-CM | POA: Diagnosis not present

## 2022-11-30 DIAGNOSIS — I4892 Unspecified atrial flutter: Secondary | ICD-10-CM | POA: Diagnosis not present

## 2022-11-30 DIAGNOSIS — R079 Chest pain, unspecified: Secondary | ICD-10-CM | POA: Diagnosis not present

## 2022-11-30 DIAGNOSIS — R410 Disorientation, unspecified: Secondary | ICD-10-CM | POA: Diagnosis not present

## 2022-11-30 DIAGNOSIS — Z794 Long term (current) use of insulin: Secondary | ICD-10-CM | POA: Diagnosis not present

## 2022-11-30 DIAGNOSIS — E119 Type 2 diabetes mellitus without complications: Secondary | ICD-10-CM | POA: Diagnosis not present

## 2022-11-30 DIAGNOSIS — I4891 Unspecified atrial fibrillation: Secondary | ICD-10-CM | POA: Diagnosis not present

## 2022-11-30 DIAGNOSIS — E1165 Type 2 diabetes mellitus with hyperglycemia: Secondary | ICD-10-CM | POA: Diagnosis not present

## 2022-11-30 DIAGNOSIS — Z7982 Long term (current) use of aspirin: Secondary | ICD-10-CM | POA: Diagnosis not present

## 2022-11-30 DIAGNOSIS — I10 Essential (primary) hypertension: Secondary | ICD-10-CM | POA: Diagnosis not present

## 2022-11-30 DIAGNOSIS — Z7901 Long term (current) use of anticoagulants: Secondary | ICD-10-CM | POA: Diagnosis not present

## 2022-11-30 DIAGNOSIS — Z79899 Other long term (current) drug therapy: Secondary | ICD-10-CM | POA: Diagnosis not present

## 2022-12-01 DIAGNOSIS — I4892 Unspecified atrial flutter: Secondary | ICD-10-CM | POA: Diagnosis not present

## 2022-12-01 DIAGNOSIS — E1165 Type 2 diabetes mellitus with hyperglycemia: Secondary | ICD-10-CM | POA: Diagnosis not present

## 2022-12-01 DIAGNOSIS — R079 Chest pain, unspecified: Secondary | ICD-10-CM | POA: Diagnosis not present

## 2022-12-01 DIAGNOSIS — I4891 Unspecified atrial fibrillation: Secondary | ICD-10-CM | POA: Diagnosis not present

## 2022-12-04 DIAGNOSIS — K8020 Calculus of gallbladder without cholecystitis without obstruction: Secondary | ICD-10-CM | POA: Diagnosis not present

## 2022-12-06 DIAGNOSIS — I480 Paroxysmal atrial fibrillation: Secondary | ICD-10-CM | POA: Diagnosis not present

## 2022-12-06 DIAGNOSIS — E1142 Type 2 diabetes mellitus with diabetic polyneuropathy: Secondary | ICD-10-CM | POA: Diagnosis not present

## 2022-12-06 DIAGNOSIS — Z713 Dietary counseling and surveillance: Secondary | ICD-10-CM | POA: Diagnosis not present

## 2022-12-06 DIAGNOSIS — R413 Other amnesia: Secondary | ICD-10-CM | POA: Diagnosis not present

## 2022-12-06 DIAGNOSIS — R079 Chest pain, unspecified: Secondary | ICD-10-CM | POA: Diagnosis not present

## 2022-12-08 ENCOUNTER — Other Ambulatory Visit: Payer: Self-pay | Admitting: Cardiology

## 2022-12-12 ENCOUNTER — Encounter: Payer: Self-pay | Admitting: *Deleted

## 2022-12-12 ENCOUNTER — Encounter: Payer: Self-pay | Admitting: Cardiology

## 2022-12-27 DIAGNOSIS — D485 Neoplasm of uncertain behavior of skin: Secondary | ICD-10-CM | POA: Diagnosis not present

## 2023-01-16 ENCOUNTER — Ambulatory Visit: Payer: Medicare Other | Attending: Cardiology | Admitting: Cardiology

## 2023-01-17 DIAGNOSIS — J208 Acute bronchitis due to other specified organisms: Secondary | ICD-10-CM | POA: Diagnosis not present

## 2023-01-17 DIAGNOSIS — B9689 Other specified bacterial agents as the cause of diseases classified elsewhere: Secondary | ICD-10-CM | POA: Diagnosis not present

## 2023-01-22 NOTE — Progress Notes (Signed)
 " Cardiology Office Note:  .   Date:  01/23/2023  ID:  Casey Watkins, DOB [birth year], MRN [id number] PCP: Keren Vicenta BRAVO, MD  [facility] Providers Cardiologist:  None    History of Present Illness: .   Casey Watkins is a 77 y.o. female hypertension, PAF anticoagulated on Eliquis, DM 2, dyslipidemia, palpitations.  02/17/2020 Lexiscan  negative for ischemia, low risk 02/21/2020 echocardiogram EF 60 to 65%, mild AS without stenosis 12/24/2021 monitor average heart rate of 60 bpm, predominant underlying rhythm was sinus, 4 runs of SVT 12/25/2021 echocardiogram EF 60 to 65%, grade 2 DD  Most recently evaluated by Dr. Bernie on 03/08/2022, at this time she was doing well from a cardiac perspective and she was advised to follow-up in 6 months.   Most recently she was hospitalized on 11/30/2022 to 12/01/2022 with atypical chest pain, elevated blood sugar, and 1 episode of PAF.  Her A1c was elevated at 12.5, it appears she had some vaginal bleeding and she was advised to follow-up with her gynecologist.  She presents today for follow-up of her recent hospitalization at the request of her PCP.  She is a poor historian and is not accompanied by anyone, she offers no complaints.  She states she has had no vaginal bleeding.  She is in sinus rhythm today, states she is compliant with her medications although she is not entirely sure. She denies chest pain, palpitations, dyspnea, pnd, orthopnea, n, v, dizziness, syncope, edema, weight gain, or early satiety.  Denies hematochezia, hematuria, hemoptysis, vaginal bleeding--although admission note noted she did have vaginal bleeding.   ROS: Review of Systems  Constitutional: Negative.   HENT: Negative.    Eyes: Negative.   Respiratory: Negative.    Cardiovascular: Negative.   Gastrointestinal: Negative.   Genitourinary: Negative.   Musculoskeletal: Negative.   Skin: Negative.   Neurological: Negative.   Endo/Heme/Allergies: Negative.    Psychiatric/Behavioral:  Positive for memory loss.      Studies Reviewed: SABRA   EKG Interpretation  Date/Time:  Thursday January 23 2023 09:23:10 EDT Ventricular Rate:  62 PR Interval:  162 QRS Duration: 84 QT Interval:  430 QTC Calculation: 436 R Axis:   3 Text Interpretation: Normal sinus rhythm Cannot rule out Anterior infarct , age undetermined Abnormal ECG No previous ECGs available Confirmed by Carlin Nest [phone]) on 01/23/2023 9:25:52 AM     Risk Assessment/Calculations:    CHA2DS2-VASc Score = 5   This indicates a 7.2% annual risk of stroke. The patient's score is based upon: CHF History: 0 HTN History: 1 Diabetes History: 1 Stroke History: 0 Vascular Disease History: 0 Age Score: 2 Gender Score: 1        STOP-Bang Score:         Physical Exam:   VS:  BP 120/70 (BP Location: Right Arm, Patient Position: Sitting, Cuff Size: Normal)   Pulse 62   Ht 5' (1.524 m)   Wt 168 lb (76.2 kg)   SpO2 96%   BMI 32.81 kg/m    Wt Readings from Last 3 Encounters:  01/23/23 168 lb (76.2 kg)  03/08/22 169 lb (76.7 kg)  12/04/21 165 lb 12.8 oz (75.2 kg)    GEN: Well nourished, well developed in no acute distress NECK: No JVD; No carotid bruits CARDIAC: RRR, no murmurs, rubs, gallops RESPIRATORY:  Clear to auscultation without rales, wheezing or rhonchi  ABDOMEN: Soft, non-tender, non-distended EXTREMITIES:  No edema; No deformity   ASSESSMENT AND PLAN: .  PAF/hypercoagulable state -CHA2DS2-VASc score of 5, she is in sinus rhythm today.  Continue Eliquis 5 mg twice daily--no indication for dose reduction, continue metoprolol 25 mg twice daily.  Will repeat CBC as it was reported she had vaginal bleeding during her hospital admission, BMET to make sure she is still on the right dose of Eliquis.  Diastolic dysfunction - Most recent 2023 revealed grade 2 DD, euvolemic, denies shortness of breath.  Hypertension -blood pressure is well-controlled at 120/70, continue Norvasc  5  mg daily, continue metoprolol 25 mg twice daily.  Hyperlipidemia -most recent LDL was well-controlled at 55 in April of this year, continue Lipitor 20 mg daily  DM 2-most recent A1c was elevated at 12.5, this is managed by her PCP, Heart healthy diet and regular cardiovascular exercise encouraged.         Dispo: Follow-up in 3 months with Dr. Krasowski, repeat CBC BMET today.  Signed, Delon JAYSON Hoover, NP  "

## 2023-01-23 ENCOUNTER — Encounter: Payer: Self-pay | Admitting: Cardiology

## 2023-01-23 ENCOUNTER — Ambulatory Visit: Payer: Medicare Other | Attending: Cardiology | Admitting: Cardiology

## 2023-01-23 VITALS — BP 120/70 | HR 62 | Ht 60.0 in | Wt 168.0 lb

## 2023-01-23 DIAGNOSIS — I10 Essential (primary) hypertension: Secondary | ICD-10-CM | POA: Diagnosis not present

## 2023-01-23 DIAGNOSIS — I480 Paroxysmal atrial fibrillation: Secondary | ICD-10-CM | POA: Diagnosis not present

## 2023-01-23 DIAGNOSIS — I5189 Other ill-defined heart diseases: Secondary | ICD-10-CM | POA: Diagnosis not present

## 2023-01-23 DIAGNOSIS — E782 Mixed hyperlipidemia: Secondary | ICD-10-CM | POA: Diagnosis not present

## 2023-01-23 NOTE — Patient Instructions (Signed)
Medication Instructions:  Your physician recommends that you continue on your current medications as directed. Please refer to the Current Medication list given to you today.  *If you need a refill on your cardiac medications before your next appointment, please call your pharmacy*   Lab Work: Your physician recommends that you return for lab work in: Today for BMP and CBC  If you have labs (blood work) drawn today and your tests are completely normal, you will receive your results only by: MyChart Message (if you have MyChart) OR A paper copy in the mail If you have any lab test that is abnormal or we need to change your treatment, we will call you to review the results.   Testing/Procedures: NONE   Follow-Up: At [facility], you and your health needs are our priority.  As part of our continuing mission to provide you with exceptional heart care, we have created designated Provider Care Teams.  These Care Teams include your primary Cardiologist (physician) and Advanced Practice Providers (APPs -  Physician Assistants and Nurse Practitioners) who all work together to provide you with the care you need, when you need it.  We recommend signing up for the patient portal called "MyChart".  Sign up information is provided on this After Visit Summary.  MyChart is used to connect with patients for Virtual Visits (Telemedicine).  Patients are able to view lab/test results, encounter notes, upcoming appointments, etc.  Non-urgent messages can be sent to your provider as well.   To learn more about what you can do with MyChart, go to [email or web address].    Your next appointment:   3 month(s)  Provider:   Gypsy Balsam, MD    Other Instructions

## 2023-01-24 LAB — CBC WITH DIFFERENTIAL/PLATELET
Basophils Absolute: 0.1 x10E3/uL (ref 0.0–0.2)
Basos: 1 %
EOS (ABSOLUTE): 0.2 x10E3/uL (ref 0.0–0.4)
Eos: 2 %
Hematocrit: 44.6 % (ref 34.0–46.6)
Hemoglobin: 14.8 g/dL (ref 11.1–15.9)
Immature Grans (Abs): 0.1 x10E3/uL (ref 0.0–0.1)
Immature Granulocytes: 1 %
Lymphocytes Absolute: 2.7 x10E3/uL (ref 0.7–3.1)
Lymphs: 26 %
MCH: 30.5 pg (ref 26.6–33.0)
MCHC: 33.2 g/dL (ref 31.5–35.7)
MCV: 92 fL (ref 79–97)
Monocytes Absolute: 1.1 x10E3/uL — ABNORMAL HIGH (ref 0.1–0.9)
Monocytes: 11 %
Neutrophils Absolute: 6 x10E3/uL (ref 1.4–7.0)
Neutrophils: 59 %
Platelets: 198 x10E3/uL (ref 150–450)
RBC: 4.86 x10E6/uL (ref 3.77–5.28)
RDW: 13.1 % (ref 11.7–15.4)
WBC: 10 x10E3/uL (ref 3.4–10.8)

## 2023-01-24 LAB — BASIC METABOLIC PANEL WITH GFR
BUN/Creatinine Ratio: 36 — ABNORMAL HIGH (ref 12–28)
BUN: 35 mg/dL — ABNORMAL HIGH (ref 8–27)
CO2: 26 mmol/L (ref 20–29)
Calcium: 10 mg/dL (ref 8.7–10.3)
Chloride: 99 mmol/L (ref 96–106)
Creatinine, Ser: 0.98 mg/dL (ref 0.57–1.00)
Glucose: 233 mg/dL — ABNORMAL HIGH (ref 70–99)
Potassium: 4.7 mmol/L (ref 3.5–5.2)
Sodium: 139 mmol/L (ref 134–144)
eGFR: 59 mL/min/1.73 — ABNORMAL LOW

## 2023-02-19 DIAGNOSIS — E785 Hyperlipidemia, unspecified: Secondary | ICD-10-CM | POA: Diagnosis not present

## 2023-02-19 DIAGNOSIS — I10 Essential (primary) hypertension: Secondary | ICD-10-CM | POA: Diagnosis not present

## 2023-02-19 DIAGNOSIS — E1142 Type 2 diabetes mellitus with diabetic polyneuropathy: Secondary | ICD-10-CM | POA: Diagnosis not present

## 2023-02-19 DIAGNOSIS — R252 Cramp and spasm: Secondary | ICD-10-CM | POA: Diagnosis not present

## 2023-02-19 DIAGNOSIS — I480 Paroxysmal atrial fibrillation: Secondary | ICD-10-CM | POA: Diagnosis not present

## 2023-02-19 DIAGNOSIS — E559 Vitamin D deficiency, unspecified: Secondary | ICD-10-CM | POA: Diagnosis not present

## 2023-02-19 DIAGNOSIS — R413 Other amnesia: Secondary | ICD-10-CM | POA: Diagnosis not present

## 2023-02-27 ENCOUNTER — Other Ambulatory Visit: Payer: Self-pay | Admitting: Cardiology

## 2023-03-03 ENCOUNTER — Other Ambulatory Visit: Payer: Self-pay

## 2023-03-03 DIAGNOSIS — E1169 Type 2 diabetes mellitus with other specified complication: Secondary | ICD-10-CM

## 2023-03-03 NOTE — Progress Notes (Signed)
   03/03/2023 Name: SISSY KETNER MRN: [id number] DOB: 1945-10-01  No chief complaint on file.   LORMA PASSAFIUME is a 77 y.o. year old female who presented for a telephone visit.   They were referred to the pharmacist by  [facility] to review patients transitioning from [facility]  for assistance in managing  medications/chronic disease states .    Subjective:  Care Team: Primary Care Provider: Paulina Fusi, MD ; Next Scheduled Visit: 05/27/2023   Medication Access/Adherence  Current Pharmacy:  [facility] - [place], [place] - [address] [place] [address] Phone: [phone] Fax: [phone]  Brief Summary:  Patient was outreached today to discuss medication compliance as hemoglobin A1c (12.5%) and LDL (160 mg/dL) were elevated, lifestyle modifications, and inquiry on patient assistance. Initially, patient stated she was in the car and it wasn't a good time to talk but later stated that she could talk once I informed her about the purpose for this call. Patient was in the car with her husband who stated that Ms. Zwicker doesn't need medication assistance and Ms. Pinch declined patient assistance. This pharmacist educated patient on medication assistance and she agreed to see if she qualifies. However, once I begin the medication review, Ms. Dahlquist stated that she would like to reschedule  this call for another day.   Given this pharmacist was unable to complete the outreach as anticipated, will reach out to patient another day.   Thank you for allowing pharmacy to be a part of this patient's care. Cephus Shelling, PharmD Clinical Pharmacist [facility] Cell: [phone]

## 2023-03-10 ENCOUNTER — Other Ambulatory Visit: Payer: Self-pay

## 2023-03-10 DIAGNOSIS — E1169 Type 2 diabetes mellitus with other specified complication: Secondary | ICD-10-CM

## 2023-03-10 DIAGNOSIS — E785 Hyperlipidemia, unspecified: Secondary | ICD-10-CM

## 2023-03-10 NOTE — Progress Notes (Signed)
   03/10/2023  Patient ID: Casey Watkins, female   DOB: 1946-07-09, 77 y.o.   MRN: [id number]  [facility]) Quality Pharmacy Team  [facility]   03/10/2023  Casey Watkins 03/06/1946 [id number]   Reason for referral: Medication Assistance  Referral source:  [facility] Transition Report for CCM Bridge in Care  Current insurance: [employer]  Reason for call: They were referred to the pharmacist by  [facility] to review patients transitioning from [facility]  for assistance in managing  medications/chronic disease states. Spouse stated patient was in another room in the house and asked that I call back another day.   Outreach:  Unsuccessful telephone call attempt #2 to patient.   Unable to leave message  Plan:  -I will make another outreach attempt to patient within 3-4 business days.   Thank you for allowing pharmacy to be a part of this patient's care.  Cephus Shelling, PharmD Clinical Pharmacist [facility] Cell: [phone]

## 2023-03-17 ENCOUNTER — Telehealth: Payer: Self-pay

## 2023-03-17 DIAGNOSIS — E1169 Type 2 diabetes mellitus with other specified complication: Secondary | ICD-10-CM

## 2023-03-17 NOTE — Progress Notes (Signed)
   03/17/2023  Patient ID: Casey Watkins, female   DOB: [birth year], 77 y.o.   MRN: [id number]  [facility]) Quality Pharmacy Team  [facility]   03/17/2023  KESHAWNNA PAUGH 09-Jul-1946 [id number]   Reason for referral: Medication Assistance  Referral source:  [facility] Transition Report for CCM Bridge in Care  Current insurance: [employer]  Reason for call: They were referred to the pharmacist by  [facility] to review patients transitioning from [facility]  for assistance in managing  medications/chronic disease states. Patient reports she is out at a store and unable to discuss medication review.   Outreach:  Unsuccessful telephone call attempt #3 to patient.   Message left with Casey Watkins requesting a return call from patient  Plan:  -I will close [facility] pharmacy case at this time as I have been unable to establish and/or maintain contact with patient.  -I am happy to assist in the future as needed.   Thank you for allowing pharmacy to be a part of this patient's care.  Cephus Shelling, PharmD Clinical Pharmacist [facility] Cell: [phone]

## 2023-03-24 ENCOUNTER — Other Ambulatory Visit: Payer: Self-pay | Admitting: Cardiology

## 2023-04-04 DIAGNOSIS — J208 Acute bronchitis due to other specified organisms: Secondary | ICD-10-CM | POA: Diagnosis not present

## 2023-04-09 ENCOUNTER — Other Ambulatory Visit: Payer: Self-pay | Admitting: Cardiology

## 2023-04-15 DIAGNOSIS — J929 Pleural plaque without asbestos: Secondary | ICD-10-CM | POA: Diagnosis not present

## 2023-04-15 DIAGNOSIS — R053 Chronic cough: Secondary | ICD-10-CM | POA: Diagnosis not present

## 2023-04-15 DIAGNOSIS — J984 Other disorders of lung: Secondary | ICD-10-CM | POA: Diagnosis not present

## 2023-04-15 DIAGNOSIS — R079 Chest pain, unspecified: Secondary | ICD-10-CM | POA: Diagnosis not present

## 2023-04-22 DIAGNOSIS — M2012 Hallux valgus (acquired), left foot: Secondary | ICD-10-CM | POA: Diagnosis not present

## 2023-04-22 DIAGNOSIS — E119 Type 2 diabetes mellitus without complications: Secondary | ICD-10-CM | POA: Diagnosis not present

## 2023-04-22 DIAGNOSIS — M2042 Other hammer toe(s) (acquired), left foot: Secondary | ICD-10-CM | POA: Diagnosis not present

## 2023-04-22 DIAGNOSIS — B351 Tinea unguium: Secondary | ICD-10-CM | POA: Diagnosis not present

## 2023-04-22 DIAGNOSIS — M79675 Pain in left toe(s): Secondary | ICD-10-CM | POA: Diagnosis not present

## 2023-04-24 DIAGNOSIS — L821 Other seborrheic keratosis: Secondary | ICD-10-CM | POA: Diagnosis not present

## 2023-04-24 DIAGNOSIS — R233 Spontaneous ecchymoses: Secondary | ICD-10-CM | POA: Diagnosis not present

## 2023-04-24 DIAGNOSIS — L578 Other skin changes due to chronic exposure to nonionizing radiation: Secondary | ICD-10-CM | POA: Diagnosis not present

## 2023-04-24 DIAGNOSIS — L570 Actinic keratosis: Secondary | ICD-10-CM | POA: Diagnosis not present

## 2023-04-25 ENCOUNTER — Ambulatory Visit: Payer: Medicare Other | Attending: Cardiology | Admitting: Cardiology

## 2023-05-13 DIAGNOSIS — Z23 Encounter for immunization: Secondary | ICD-10-CM | POA: Diagnosis not present

## 2023-05-13 DIAGNOSIS — B370 Candidal stomatitis: Secondary | ICD-10-CM | POA: Diagnosis not present

## 2023-06-03 DIAGNOSIS — E785 Hyperlipidemia, unspecified: Secondary | ICD-10-CM | POA: Diagnosis not present

## 2023-06-03 DIAGNOSIS — I10 Essential (primary) hypertension: Secondary | ICD-10-CM | POA: Diagnosis not present

## 2023-06-03 DIAGNOSIS — I480 Paroxysmal atrial fibrillation: Secondary | ICD-10-CM | POA: Diagnosis not present

## 2023-06-03 DIAGNOSIS — R053 Chronic cough: Secondary | ICD-10-CM | POA: Diagnosis not present

## 2023-06-03 DIAGNOSIS — E559 Vitamin D deficiency, unspecified: Secondary | ICD-10-CM | POA: Diagnosis not present

## 2023-06-03 DIAGNOSIS — E1142 Type 2 diabetes mellitus with diabetic polyneuropathy: Secondary | ICD-10-CM | POA: Diagnosis not present

## 2023-06-03 DIAGNOSIS — R600 Localized edema: Secondary | ICD-10-CM | POA: Diagnosis not present

## 2023-06-03 DIAGNOSIS — R252 Cramp and spasm: Secondary | ICD-10-CM | POA: Diagnosis not present

## 2023-06-03 DIAGNOSIS — K7469 Other cirrhosis of liver: Secondary | ICD-10-CM | POA: Diagnosis not present

## 2023-06-04 DIAGNOSIS — I10 Essential (primary) hypertension: Secondary | ICD-10-CM | POA: Diagnosis not present

## 2023-06-04 DIAGNOSIS — E785 Hyperlipidemia, unspecified: Secondary | ICD-10-CM | POA: Diagnosis not present

## 2023-06-04 DIAGNOSIS — E1142 Type 2 diabetes mellitus with diabetic polyneuropathy: Secondary | ICD-10-CM | POA: Diagnosis not present

## 2023-07-10 DIAGNOSIS — R600 Localized edema: Secondary | ICD-10-CM | POA: Diagnosis not present

## 2023-07-21 DIAGNOSIS — S80822A Blister (nonthermal), left lower leg, initial encounter: Secondary | ICD-10-CM | POA: Diagnosis not present

## 2023-07-21 DIAGNOSIS — R600 Localized edema: Secondary | ICD-10-CM | POA: Diagnosis not present

## 2023-07-29 ENCOUNTER — Ambulatory Visit: Payer: Medicare Other | Admitting: Allergy

## 2023-09-05 DIAGNOSIS — R600 Localized edema: Secondary | ICD-10-CM | POA: Diagnosis not present

## 2023-09-05 DIAGNOSIS — E785 Hyperlipidemia, unspecified: Secondary | ICD-10-CM | POA: Diagnosis not present

## 2023-09-05 DIAGNOSIS — I480 Paroxysmal atrial fibrillation: Secondary | ICD-10-CM | POA: Diagnosis not present

## 2023-09-05 DIAGNOSIS — I10 Essential (primary) hypertension: Secondary | ICD-10-CM | POA: Diagnosis not present

## 2023-09-05 DIAGNOSIS — E559 Vitamin D deficiency, unspecified: Secondary | ICD-10-CM | POA: Diagnosis not present

## 2023-09-05 DIAGNOSIS — R252 Cramp and spasm: Secondary | ICD-10-CM | POA: Diagnosis not present

## 2023-09-05 DIAGNOSIS — E1142 Type 2 diabetes mellitus with diabetic polyneuropathy: Secondary | ICD-10-CM | POA: Diagnosis not present

## 2023-09-08 DIAGNOSIS — M79675 Pain in left toe(s): Secondary | ICD-10-CM | POA: Diagnosis not present

## 2023-09-08 DIAGNOSIS — B351 Tinea unguium: Secondary | ICD-10-CM | POA: Diagnosis not present

## 2023-09-08 DIAGNOSIS — E119 Type 2 diabetes mellitus without complications: Secondary | ICD-10-CM | POA: Diagnosis not present

## 2023-10-10 ENCOUNTER — Other Ambulatory Visit: Payer: Self-pay

## 2023-10-10 ENCOUNTER — Encounter (HOSPITAL_COMMUNITY): Payer: Self-pay

## 2023-10-10 ENCOUNTER — Emergency Department (HOSPITAL_COMMUNITY)
Admission: EM | Admit: 2023-10-10 | Discharge: 2023-10-10 | Disposition: A | Discharge status: Home / Self Care | Attending: Emergency Medicine | Admitting: Emergency Medicine

## 2023-10-10 DIAGNOSIS — Z7982 Long term (current) use of aspirin: Secondary | ICD-10-CM | POA: Diagnosis not present

## 2023-10-10 DIAGNOSIS — G510 Bell's palsy: Secondary | ICD-10-CM | POA: Diagnosis not present

## 2023-10-10 DIAGNOSIS — R29810 Facial weakness: Secondary | ICD-10-CM | POA: Diagnosis present

## 2023-10-10 MED ORDER — PREDNISONE 20 MG PO TABS: ORAL_TABLET | ORAL | 0 refills | Status: AC

## 2023-10-10 MED ORDER — FLUORESCEIN SODIUM 1 MG OP STRP
1.0000 | ORAL_STRIP | Freq: Once | OPHTHALMIC | Status: AC
  Administered 2023-10-10: 1 via OPHTHALMIC
  Filled 2023-10-10: qty 1

## 2023-10-10 MED ORDER — TETRACAINE HCL 0.5 % OP SOLN
2.0000 [drp] | Freq: Once | OPHTHALMIC | Status: AC
  Administered 2023-10-10: 2 [drp] via OPHTHALMIC
  Filled 2023-10-10: qty 4

## 2023-10-10 MED ORDER — VALACYCLOVIR HCL 1 G PO TABS: 1000.0000 mg | ORAL_TABLET | Freq: Three times a day (TID) | ORAL | 0 refills | Status: AC

## 2023-10-10 NOTE — ED Triage Notes (Addendum)
 Pt c/o headache and right sided facial pain and drooping that started yesterday.

## 2023-10-10 NOTE — ED Provider Notes (Signed)
 [place] EMERGENCY DEPARTMENT AT [facility] Provider Note   CSN: [id number] Arrival date & time: 10/10/23  8295     History  Chief Complaint  Patient presents with   Facial Droop    Casey Watkins is a 78 y.o. female.  78 yo F with a chief complaints of right-sided facial droop.  This was noticed yesterday.  She also has some pain to that side of her face.  She does have some pain to the ear as well as the eye.  She denies trauma denies otherwise one-sided numbness or weakness or difficulty with speech or swallowing.        Home Medications Prior to Admission medications   Medication Sig Start Date End Date Taking? Authorizing Provider  predniSONE (DELTASONE) 20 MG tablet 3 tabs po daily x 3 days, then 2 tabs x 3 days, then 1.5 tabs x 3 days, then 1 tab x 3 days, then 0.5 tabs x 3 days 10/10/23  Yes Melene Plan, DO  valACYclovir (VALTREX) 1000 MG tablet Take 1 tablet (1,000 mg total) by mouth 3 (three) times daily. 10/10/23  Yes Melene Plan, DO  alendronate (FOSAMAX) 70 MG tablet Take 70 mg by mouth once a week. 05/05/17   [provider]  amLODipine (NORVASC) 5 MG tablet Take 5 mg by mouth daily.    [provider]  aspirin EC 81 MG tablet Take 81 mg by mouth daily. Watkins whole.    [provider]  atorvastatin (LIPITOR) 20 MG tablet Take 20 mg by mouth at bedtime. 12/02/22   [provider]  Calcium Carbonate-Vit D-Min (RA CALCIUM 600/VIT D/MINERALS) 600-200 MG-UNIT TABS Take 1 tablet by mouth in the morning and at bedtime.    [provider]  cetirizine (ZYRTEC) 10 MG tablet Take 10 mg by mouth at bedtime.    [provider]  Cholecalciferol (VITAMIN D3) 25 MCG (1000 UT) CAPS Take 1,000 Units by mouth daily.    [provider]  donepezil (ARICEPT) 10 MG tablet Take 10 mg by mouth at bedtime. 09/13/22   [provider]  ELIQUIS 5 MG TABS tablet Take 5 mg by mouth 2 (two) times daily. 11/15/21    [provider]  famotidine (PEPCID) 20 MG tablet Take 20 mg by mouth 2 (two) times daily.    [provider]  furosemide (LASIX) 40 MG tablet Take 40 mg by mouth daily. 10/14/19   [provider]  glipiZIDE (GLUCOTROL) 5 MG tablet Take 5 mg by mouth daily. 03/17/17   [provider]  glucosamine-chondroitin 500-400 MG tablet Take 1 tablet by mouth 2 (two) times daily.    [provider]  ibuprofen (ADVIL) 800 MG tablet Take 800 mg by mouth 3 (three) times daily as needed for pain. 06/13/21   [provider]  insulin lispro (HUMALOG KWIKPEN) 200 UNIT/ML KwikPen Inject 15 Units into the skin 3 (three) times daily.    [provider]  lisinopril (ZESTRIL) 40 MG tablet Take 40 mg by mouth daily.    [provider]  meclizine (ANTIVERT) 25 MG tablet Take 25 mg by mouth 3 (three) times daily as needed for dizziness. 09/03/22   [provider]  memantine (NAMENDA) 10 MG tablet Take 10 mg by mouth 2 (two) times daily. 11/29/22   [provider]  metoprolol tartrate (LOPRESSOR) 25 MG tablet Take 25 mg by mouth 2 (two) times daily. 01/18/22   [provider]  Multiple Vitamin (MULTIVITAMIN)  tablet Take 1 tablet by mouth daily.    [provider]  omeprazole (PRILOSEC) 40 MG capsule Take 40 mg by mouth daily. 04/18/17   [provider]  ondansetron (ZOFRAN-ODT) 4 MG disintegrating tablet Take 4 mg by mouth every 4 (four) hours as needed for nausea or vomiting. 09/06/22   [provider]  pramipexole (MIRAPEX) 0.5 MG tablet Take 0.5 mg by mouth at bedtime. 11/20/22   [provider]  TRESIBA FLEXTOUCH 200 UNIT/ML FlexTouch Pen Inject 76 Units into the skin daily. 01/28/22   [provider]  zolpidem (AMBIEN) 10 MG tablet Take 10 mg by mouth at bedtime as needed for sleep. 04/11/17   [provider]      Allergies    Shellfish allergy    Review of Systems   Review of  Systems  Physical Exam Updated Vital Signs BP (!) 169/86   Pulse (!) 114   Temp 98.5 F (36.9 C) (Oral)   Resp 16   Ht 5' (1.524 m)   Wt 76.2 kg   SpO2 100%   BMI 32.81 kg/m  Physical Exam Vitals and nursing note reviewed.  Constitutional:      General: She is not in acute distress.    Appearance: She is well-developed. She is not diaphoretic.  HENT:     Head: Normocephalic and atraumatic.  Eyes:     Pupils: Pupils are equal, round, and reactive to light.  Cardiovascular:     Rate and Rhythm: Normal rate and regular rhythm.     Heart sounds: No murmur heard.    No friction rub. No gallop.  Pulmonary:     Effort: Pulmonary effort is normal.     Breath sounds: No wheezing or rales.  Abdominal:     General: There is no distension.     Palpations: Abdomen is soft.     Tenderness: There is no abdominal tenderness.  Musculoskeletal:        General: No tenderness.     Cervical back: Normal range of motion and neck supple.  Skin:    General: Skin is warm and dry.  Neurological:     Mental Status: She is alert and oriented to person, place, and time.     GCS: GCS eye subscore is 4. GCS verbal subscore is 5. GCS motor subscore is 6.     Sensory: Sensation is intact.     Motor: Motor function is intact.     Coordination: Coordination is intact.     Comments: Right-sided facial droop involves the forehead.  Otherwise benign neurologic exam  She does have a lesion to her right temporal area.  Psychiatric:        Behavior: Behavior normal.     ED Results / Procedures / Treatments   Labs (all labs ordered are listed, but only abnormal results are displayed) Labs Reviewed - No data to display  EKG None  Radiology No results found.  Procedures Procedures    Medications Ordered in ED Medications  tetracaine (PONTOCAINE) 0.5 % ophthalmic solution 2 drop (2 drops Right Eye Given 10/10/23 0923)  fluorescein ophthalmic strip 1 strip (1 strip Right Eye Given 10/10/23  6045)    ED Course/ Medical Decision Making/ A&P                                 Medical Decision Making Risk Prescription drug management.   78 yo F  with a cc of right-sided facial droop.  This was noticed yesterday.  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  She is having some pain to the right eye.  Will perform fluorescein exam.  Likely start the patient on acyclovir and steroids.  She does have a history of diabetes will have her call her family doctor to try and help manage her hyperglycemia if it occurs at home.  Fluorescein exam without obvious concern for eye involvement.  Will start the patient on steroids and antivirals.  Given ophthalmology and neurology follow-up.  9:25 AM:  I have discussed the diagnosis/risks/treatment options with the patient.  Evaluation and diagnostic testing in the emergency department does not suggest an emergent condition requiring admission or immediate intervention beyond what has been performed at this time.  They will follow up with PCP, neuro, optho. We also discussed returning to the ED immediately if new or worsening sx occur. We discussed the sx which are most concerning (e.g., sudden worsening pain, fever, inability to tolerate by mouth) that necessitate immediate return. Medications administered to the patient during their visit and any new prescriptions provided to the patient are listed below.  Medications given during this visit Medications  tetracaine (PONTOCAINE) 0.5 % ophthalmic solution 2 drop (2 drops Right Eye Given 10/10/23 0923)  fluorescein ophthalmic strip 1 strip (1 strip Right Eye Given 10/10/23 0981)     The patient appears reasonably screen and/or stabilized for discharge and I doubt any other medical condition or other [facility] requiring further screening, evaluation, or treatment in the ED at this time prior to discharge.          Final Clinical Impression(s) / ED Diagnoses Final  diagnoses:  Bell's palsy    Rx / DC Orders ED Discharge Orders          Ordered    predniSONE (DELTASONE) 20 MG tablet        10/10/23 0907    valACYclovir (VALTREX) 1000 MG tablet  3 times daily        10/10/23 1914    Ambulatory referral to Neurology       Comments: Bells palsy   10/10/23 0923              Melene Plan, DO 10/10/23 [phone]

## 2023-10-10 NOTE — Discharge Instructions (Signed)
 It is very common for your eye to get irritated if you have trouble closing it fully.  Please tape it shut at night.  You can use the lubricating eyedrops during the day.  I typically would have you use the preservative-free ones.  You can open a vial and use it is much as you want throughout the day and then have to throw it away at the end of the day.  Please follow-up with the ophthalmologist and neurologist in the office.  The ophthalmologist you need to call to set up an appointment the neurology office should call you to try and set up an appointment.  Please let your family doctor know about your visit.  They may need to adjust your diabetes medications with you being on steroids.

## 2023-10-13 DIAGNOSIS — G510 Bell's palsy: Secondary | ICD-10-CM | POA: Diagnosis not present

## 2023-11-05 DIAGNOSIS — G510 Bell's palsy: Secondary | ICD-10-CM | POA: Diagnosis not present

## 2023-11-05 DIAGNOSIS — R519 Headache, unspecified: Secondary | ICD-10-CM | POA: Diagnosis not present

## 2023-11-05 DIAGNOSIS — H538 Other visual disturbances: Secondary | ICD-10-CM | POA: Diagnosis not present

## 2023-11-13 DIAGNOSIS — R9082 White matter disease, unspecified: Secondary | ICD-10-CM | POA: Diagnosis not present

## 2023-11-13 DIAGNOSIS — R519 Headache, unspecified: Secondary | ICD-10-CM | POA: Diagnosis not present

## 2023-11-13 DIAGNOSIS — G510 Bell's palsy: Secondary | ICD-10-CM | POA: Diagnosis not present

## 2023-11-19 DIAGNOSIS — E876 Hypokalemia: Secondary | ICD-10-CM | POA: Diagnosis not present

## 2023-11-19 DIAGNOSIS — Z794 Long term (current) use of insulin: Secondary | ICD-10-CM | POA: Diagnosis not present

## 2023-11-19 DIAGNOSIS — M25552 Pain in left hip: Secondary | ICD-10-CM | POA: Diagnosis not present

## 2023-11-19 DIAGNOSIS — N3001 Acute cystitis with hematuria: Secondary | ICD-10-CM | POA: Diagnosis not present

## 2023-11-19 DIAGNOSIS — Z7982 Long term (current) use of aspirin: Secondary | ICD-10-CM | POA: Diagnosis not present

## 2023-11-19 DIAGNOSIS — M5432 Sciatica, left side: Secondary | ICD-10-CM | POA: Diagnosis not present

## 2023-11-19 DIAGNOSIS — K529 Noninfective gastroenteritis and colitis, unspecified: Secondary | ICD-10-CM | POA: Diagnosis not present

## 2023-11-19 DIAGNOSIS — Z79899 Other long term (current) drug therapy: Secondary | ICD-10-CM | POA: Diagnosis not present

## 2023-11-24 DIAGNOSIS — M79605 Pain in left leg: Secondary | ICD-10-CM | POA: Diagnosis not present

## 2023-11-24 DIAGNOSIS — Z794 Long term (current) use of insulin: Secondary | ICD-10-CM | POA: Diagnosis not present

## 2023-11-24 DIAGNOSIS — M7989 Other specified soft tissue disorders: Secondary | ICD-10-CM | POA: Diagnosis not present

## 2023-11-24 DIAGNOSIS — R519 Headache, unspecified: Secondary | ICD-10-CM | POA: Diagnosis not present

## 2023-11-24 DIAGNOSIS — R531 Weakness: Secondary | ICD-10-CM | POA: Diagnosis not present

## 2023-11-24 DIAGNOSIS — Z79899 Other long term (current) drug therapy: Secondary | ICD-10-CM | POA: Diagnosis not present

## 2023-11-24 DIAGNOSIS — Z7982 Long term (current) use of aspirin: Secondary | ICD-10-CM | POA: Diagnosis not present

## 2023-11-24 DIAGNOSIS — R251 Tremor, unspecified: Secondary | ICD-10-CM | POA: Diagnosis not present

## 2023-11-24 DIAGNOSIS — R9431 Abnormal electrocardiogram [ECG] [EKG]: Secondary | ICD-10-CM | POA: Diagnosis not present

## 2023-11-24 DIAGNOSIS — G252 Other specified forms of tremor: Secondary | ICD-10-CM | POA: Diagnosis not present

## 2023-11-24 DIAGNOSIS — R0902 Hypoxemia: Secondary | ICD-10-CM | POA: Diagnosis not present

## 2023-11-24 DIAGNOSIS — M79604 Pain in right leg: Secondary | ICD-10-CM | POA: Diagnosis not present

## 2023-11-27 DIAGNOSIS — G510 Bell's palsy: Secondary | ICD-10-CM | POA: Diagnosis not present

## 2023-11-27 DIAGNOSIS — K529 Noninfective gastroenteritis and colitis, unspecified: Secondary | ICD-10-CM | POA: Diagnosis not present

## 2023-11-27 DIAGNOSIS — N390 Urinary tract infection, site not specified: Secondary | ICD-10-CM | POA: Diagnosis not present

## 2023-11-27 DIAGNOSIS — M25552 Pain in left hip: Secondary | ICD-10-CM | POA: Diagnosis not present

## 2023-11-27 DIAGNOSIS — E876 Hypokalemia: Secondary | ICD-10-CM | POA: Diagnosis not present

## 2023-11-29 ENCOUNTER — Other Ambulatory Visit: Payer: Self-pay | Admitting: Cardiology

## 2023-12-01 DIAGNOSIS — E876 Hypokalemia: Secondary | ICD-10-CM | POA: Diagnosis not present

## 2023-12-01 DIAGNOSIS — E11649 Type 2 diabetes mellitus with hypoglycemia without coma: Secondary | ICD-10-CM | POA: Diagnosis not present

## 2023-12-01 DIAGNOSIS — R9431 Abnormal electrocardiogram [ECG] [EKG]: Secondary | ICD-10-CM | POA: Diagnosis not present

## 2023-12-01 DIAGNOSIS — Z7901 Long term (current) use of anticoagulants: Secondary | ICD-10-CM | POA: Diagnosis not present

## 2023-12-01 DIAGNOSIS — R42 Dizziness and giddiness: Secondary | ICD-10-CM | POA: Diagnosis not present

## 2023-12-01 DIAGNOSIS — I4891 Unspecified atrial fibrillation: Secondary | ICD-10-CM | POA: Diagnosis not present

## 2023-12-01 DIAGNOSIS — Z794 Long term (current) use of insulin: Secondary | ICD-10-CM | POA: Diagnosis not present

## 2023-12-01 DIAGNOSIS — E162 Hypoglycemia, unspecified: Secondary | ICD-10-CM | POA: Diagnosis not present

## 2023-12-01 DIAGNOSIS — R069 Unspecified abnormalities of breathing: Secondary | ICD-10-CM | POA: Diagnosis not present

## 2023-12-01 DIAGNOSIS — R531 Weakness: Secondary | ICD-10-CM | POA: Diagnosis not present

## 2023-12-01 DIAGNOSIS — Z7984 Long term (current) use of oral hypoglycemic drugs: Secondary | ICD-10-CM | POA: Diagnosis not present

## 2023-12-01 NOTE — Telephone Encounter (Signed)
 LM to return my call, question whether she's on Lipitor or Crestor .

## 2023-12-02 NOTE — Telephone Encounter (Signed)
 After reviewing notes and verifying with Dr. Linnell Richardson, he advised to verify with the patient and if on Crestor  10 and tolerates it well ok to continue. Spoke with Hewitt Lou, verified patient is on rosuvastatin  10 mg every day. Medication updated and sent.

## 2023-12-08 DIAGNOSIS — R252 Cramp and spasm: Secondary | ICD-10-CM | POA: Diagnosis not present

## 2023-12-08 DIAGNOSIS — R600 Localized edema: Secondary | ICD-10-CM | POA: Diagnosis not present

## 2023-12-08 DIAGNOSIS — E785 Hyperlipidemia, unspecified: Secondary | ICD-10-CM | POA: Diagnosis not present

## 2023-12-08 DIAGNOSIS — E11319 Type 2 diabetes mellitus with unspecified diabetic retinopathy without macular edema: Secondary | ICD-10-CM | POA: Diagnosis not present

## 2023-12-08 DIAGNOSIS — E1142 Type 2 diabetes mellitus with diabetic polyneuropathy: Secondary | ICD-10-CM | POA: Diagnosis not present

## 2023-12-08 DIAGNOSIS — I480 Paroxysmal atrial fibrillation: Secondary | ICD-10-CM | POA: Diagnosis not present

## 2023-12-08 DIAGNOSIS — I10 Essential (primary) hypertension: Secondary | ICD-10-CM | POA: Diagnosis not present

## 2023-12-08 DIAGNOSIS — E559 Vitamin D deficiency, unspecified: Secondary | ICD-10-CM | POA: Diagnosis not present

## 2023-12-24 DIAGNOSIS — E1140 Type 2 diabetes mellitus with diabetic neuropathy, unspecified: Secondary | ICD-10-CM | POA: Diagnosis not present

## 2023-12-24 DIAGNOSIS — E1142 Type 2 diabetes mellitus with diabetic polyneuropathy: Secondary | ICD-10-CM | POA: Diagnosis not present

## 2024-02-27 ENCOUNTER — Other Ambulatory Visit: Payer: Self-pay | Admitting: Cardiology

## 2024-03-01 ENCOUNTER — Other Ambulatory Visit: Payer: Self-pay | Admitting: Cardiology

## 2024-03-17 DIAGNOSIS — E559 Vitamin D deficiency, unspecified: Secondary | ICD-10-CM | POA: Diagnosis not present

## 2024-03-17 DIAGNOSIS — I10 Essential (primary) hypertension: Secondary | ICD-10-CM | POA: Diagnosis not present

## 2024-03-17 DIAGNOSIS — R252 Cramp and spasm: Secondary | ICD-10-CM | POA: Diagnosis not present

## 2024-03-17 DIAGNOSIS — E785 Hyperlipidemia, unspecified: Secondary | ICD-10-CM | POA: Diagnosis not present

## 2024-03-17 DIAGNOSIS — E1142 Type 2 diabetes mellitus with diabetic polyneuropathy: Secondary | ICD-10-CM | POA: Diagnosis not present

## 2024-03-17 DIAGNOSIS — I480 Paroxysmal atrial fibrillation: Secondary | ICD-10-CM | POA: Diagnosis not present

## 2024-03-30 ENCOUNTER — Other Ambulatory Visit: Payer: Self-pay | Admitting: Cardiology

## 2024-04-12 DIAGNOSIS — M79675 Pain in left toe(s): Secondary | ICD-10-CM | POA: Diagnosis not present

## 2024-04-12 DIAGNOSIS — B351 Tinea unguium: Secondary | ICD-10-CM | POA: Diagnosis not present

## 2024-04-12 DIAGNOSIS — E119 Type 2 diabetes mellitus without complications: Secondary | ICD-10-CM | POA: Diagnosis not present

## 2024-07-19 ENCOUNTER — Other Ambulatory Visit: Payer: Self-pay

## 2024-07-19 MED ORDER — ROSUVASTATIN CALCIUM 10 MG PO TABS: 10.0000 mg | ORAL_TABLET | Freq: Every day | ORAL | 0 refills | Status: AC

## 2024-08-03 ENCOUNTER — Other Ambulatory Visit: Payer: Self-pay | Admitting: Cardiology
